# Patient Record
Sex: Male | Born: 1950 | Race: Black or African American | Hispanic: No | Marital: Married | Smoking: Never smoker
Health system: Southern US, Community
[De-identification: ages and names within clinical notes are randomized; demographics above are authoritative.]

## PROBLEM LIST (undated history)

## (undated) DIAGNOSIS — J40 Bronchitis, not specified as acute or chronic: Secondary | ICD-10-CM

## (undated) DIAGNOSIS — I2510 Atherosclerotic heart disease of native coronary artery without angina pectoris: Secondary | ICD-10-CM

## (undated) DIAGNOSIS — M6282 Rhabdomyolysis: Secondary | ICD-10-CM

## (undated) DIAGNOSIS — Z95820 Peripheral vascular angioplasty status with implants and grafts: Secondary | ICD-10-CM

## (undated) DIAGNOSIS — R0600 Dyspnea, unspecified: Secondary | ICD-10-CM

## (undated) DIAGNOSIS — E785 Hyperlipidemia, unspecified: Secondary | ICD-10-CM

## (undated) DIAGNOSIS — G4733 Obstructive sleep apnea (adult) (pediatric): Secondary | ICD-10-CM

## (undated) DIAGNOSIS — F419 Anxiety disorder, unspecified: Secondary | ICD-10-CM

## (undated) DIAGNOSIS — G4730 Sleep apnea, unspecified: Secondary | ICD-10-CM

## (undated) DIAGNOSIS — I219 Acute myocardial infarction, unspecified: Secondary | ICD-10-CM

## (undated) DIAGNOSIS — I10 Essential (primary) hypertension: Secondary | ICD-10-CM

## (undated) DIAGNOSIS — F4310 Post-traumatic stress disorder, unspecified: Secondary | ICD-10-CM

## (undated) DIAGNOSIS — K219 Gastro-esophageal reflux disease without esophagitis: Secondary | ICD-10-CM

## (undated) HISTORY — DX: Obstructive sleep apnea (adult) (pediatric): G47.33

## (undated) HISTORY — DX: Atherosclerotic heart disease of native coronary artery without angina pectoris: I25.10

## (undated) HISTORY — DX: Rhabdomyolysis: M62.82

## (undated) HISTORY — DX: Essential (primary) hypertension: I10

## (undated) HISTORY — DX: Peripheral vascular angioplasty status with implants and grafts: Z95.820

## (undated) HISTORY — DX: Acute myocardial infarction, unspecified: I21.9

## (undated) HISTORY — PX: OTHER SURGICAL HISTORY: SHX169

## (undated) HISTORY — DX: Anxiety disorder, unspecified: F41.9

## (undated) HISTORY — DX: Dyspnea, unspecified: R06.00

## (undated) HISTORY — DX: Hyperlipidemia, unspecified: E78.5

## (undated) HISTORY — DX: Bronchitis, not specified as acute or chronic: J40

## (undated) HISTORY — DX: Gastro-esophageal reflux disease without esophagitis: K21.9

## (undated) HISTORY — DX: Sleep apnea, unspecified: G47.30

## (undated) HISTORY — DX: Post-traumatic stress disorder, unspecified: F43.10

---

## 2005-06-05 ENCOUNTER — Ambulatory Visit: Payer: Self-pay | Admitting: Cardiology

## 2005-06-06 ENCOUNTER — Inpatient Hospital Stay (HOSPITAL_COMMUNITY): Admission: EM | Admit: 2005-06-06 | Discharge: 2005-06-09 | Payer: Self-pay | Admitting: Internal Medicine

## 2005-06-09 ENCOUNTER — Ambulatory Visit: Payer: Self-pay | Admitting: Cardiology

## 2005-06-17 ENCOUNTER — Ambulatory Visit: Payer: Self-pay | Admitting: Cardiology

## 2005-07-03 ENCOUNTER — Ambulatory Visit: Payer: Self-pay | Admitting: Cardiology

## 2005-09-21 ENCOUNTER — Inpatient Hospital Stay (HOSPITAL_COMMUNITY): Admission: EM | Admit: 2005-09-21 | Discharge: 2005-09-24 | Payer: Self-pay | Admitting: Family Medicine

## 2005-09-21 ENCOUNTER — Ambulatory Visit: Payer: Self-pay | Admitting: Pulmonary Disease

## 2005-09-21 ENCOUNTER — Ambulatory Visit: Payer: Self-pay | Admitting: *Deleted

## 2005-09-22 ENCOUNTER — Encounter: Payer: Self-pay | Admitting: Cardiovascular Disease

## 2005-10-19 ENCOUNTER — Ambulatory Visit: Admission: RE | Admit: 2005-10-19 | Discharge: 2005-10-19 | Payer: Self-pay | Admitting: Pulmonary Disease

## 2005-11-03 ENCOUNTER — Ambulatory Visit: Payer: Self-pay | Admitting: Pulmonary Disease

## 2005-11-04 ENCOUNTER — Ambulatory Visit: Payer: Self-pay | Admitting: Pulmonary Disease

## 2006-01-20 ENCOUNTER — Ambulatory Visit: Payer: Self-pay | Admitting: Cardiology

## 2006-01-20 ENCOUNTER — Ambulatory Visit: Payer: Self-pay | Admitting: Internal Medicine

## 2006-01-20 ENCOUNTER — Inpatient Hospital Stay (HOSPITAL_COMMUNITY): Admission: AD | Admit: 2006-01-20 | Discharge: 2006-01-21 | Payer: Self-pay | Admitting: Internal Medicine

## 2007-06-13 ENCOUNTER — Ambulatory Visit: Payer: Self-pay | Admitting: Cardiology

## 2007-08-15 ENCOUNTER — Inpatient Hospital Stay (HOSPITAL_COMMUNITY): Admission: AD | Admit: 2007-08-15 | Discharge: 2007-08-18 | Payer: Self-pay | Admitting: Cardiology

## 2007-08-15 ENCOUNTER — Ambulatory Visit: Payer: Self-pay | Admitting: Cardiology

## 2007-08-15 ENCOUNTER — Ambulatory Visit: Payer: Self-pay | Admitting: Cardiovascular Disease

## 2007-08-24 ENCOUNTER — Ambulatory Visit: Payer: Self-pay | Admitting: Cardiology

## 2007-09-12 DIAGNOSIS — I10 Essential (primary) hypertension: Secondary | ICD-10-CM | POA: Insufficient documentation

## 2007-09-12 DIAGNOSIS — K219 Gastro-esophageal reflux disease without esophagitis: Secondary | ICD-10-CM

## 2007-09-12 DIAGNOSIS — F411 Generalized anxiety disorder: Secondary | ICD-10-CM | POA: Insufficient documentation

## 2007-09-12 DIAGNOSIS — F4310 Post-traumatic stress disorder, unspecified: Secondary | ICD-10-CM | POA: Insufficient documentation

## 2007-09-12 DIAGNOSIS — E785 Hyperlipidemia, unspecified: Secondary | ICD-10-CM

## 2007-09-12 DIAGNOSIS — I2510 Atherosclerotic heart disease of native coronary artery without angina pectoris: Secondary | ICD-10-CM | POA: Insufficient documentation

## 2007-09-13 ENCOUNTER — Ambulatory Visit: Payer: Self-pay | Admitting: Pulmonary Disease

## 2007-09-13 DIAGNOSIS — R0602 Shortness of breath: Secondary | ICD-10-CM

## 2007-09-13 DIAGNOSIS — J209 Acute bronchitis, unspecified: Secondary | ICD-10-CM

## 2007-09-14 DIAGNOSIS — J45909 Unspecified asthma, uncomplicated: Secondary | ICD-10-CM | POA: Insufficient documentation

## 2007-09-14 DIAGNOSIS — G4730 Sleep apnea, unspecified: Secondary | ICD-10-CM | POA: Insufficient documentation

## 2007-10-03 ENCOUNTER — Telehealth (INDEPENDENT_AMBULATORY_CARE_PROVIDER_SITE_OTHER): Payer: Self-pay | Admitting: *Deleted

## 2008-03-19 ENCOUNTER — Telehealth: Payer: Self-pay | Admitting: Pulmonary Disease

## 2009-03-05 DIAGNOSIS — R079 Chest pain, unspecified: Secondary | ICD-10-CM | POA: Insufficient documentation

## 2009-11-06 IMAGING — CR DG CHEST 2V
2 series · 2 of 2 positions shown · non-contrast
Comparison: 09/21/2005

CLINICAL DATA: DYSPNEA;  SHORT OF BREATH.  CHEST CONGESTION.
HYPERTENSION.  CHEST, TWO VIEWS COMPARISON

CHEST - 2 VIEW

[view not recorded (1 of 2)]
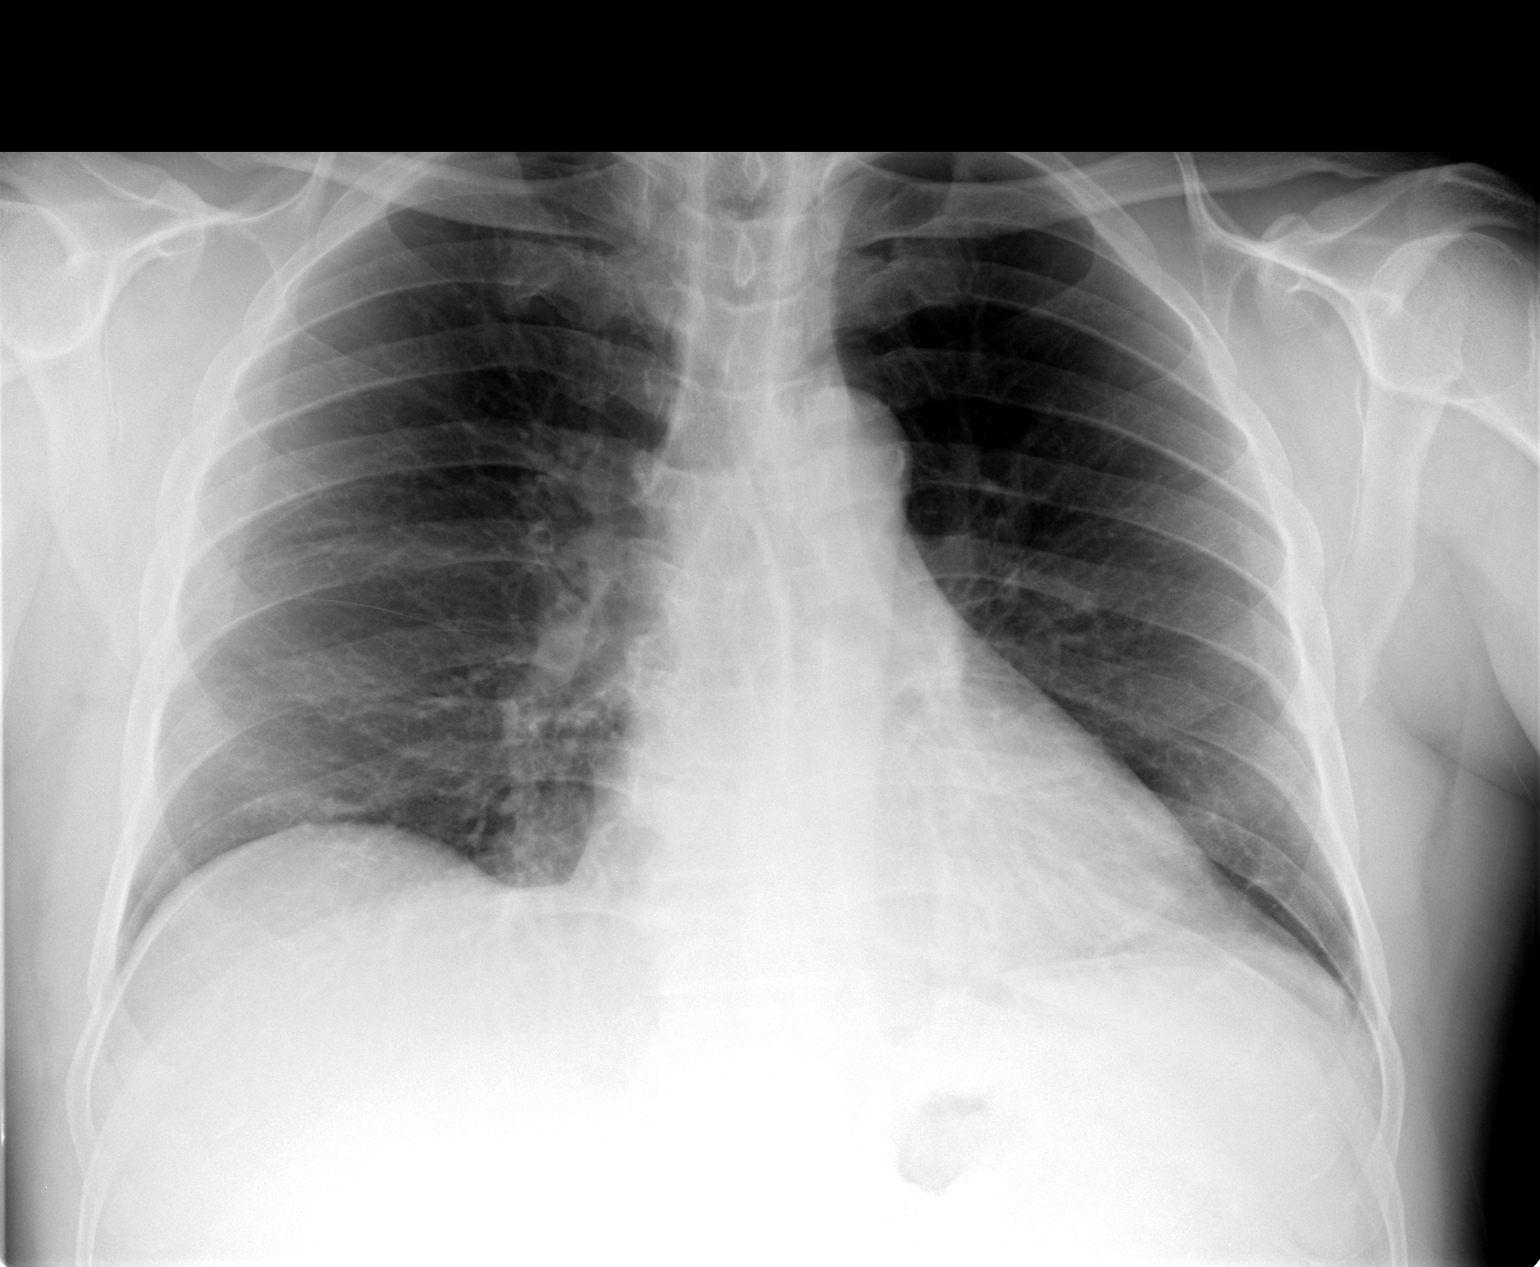

[view not recorded (2 of 2)]
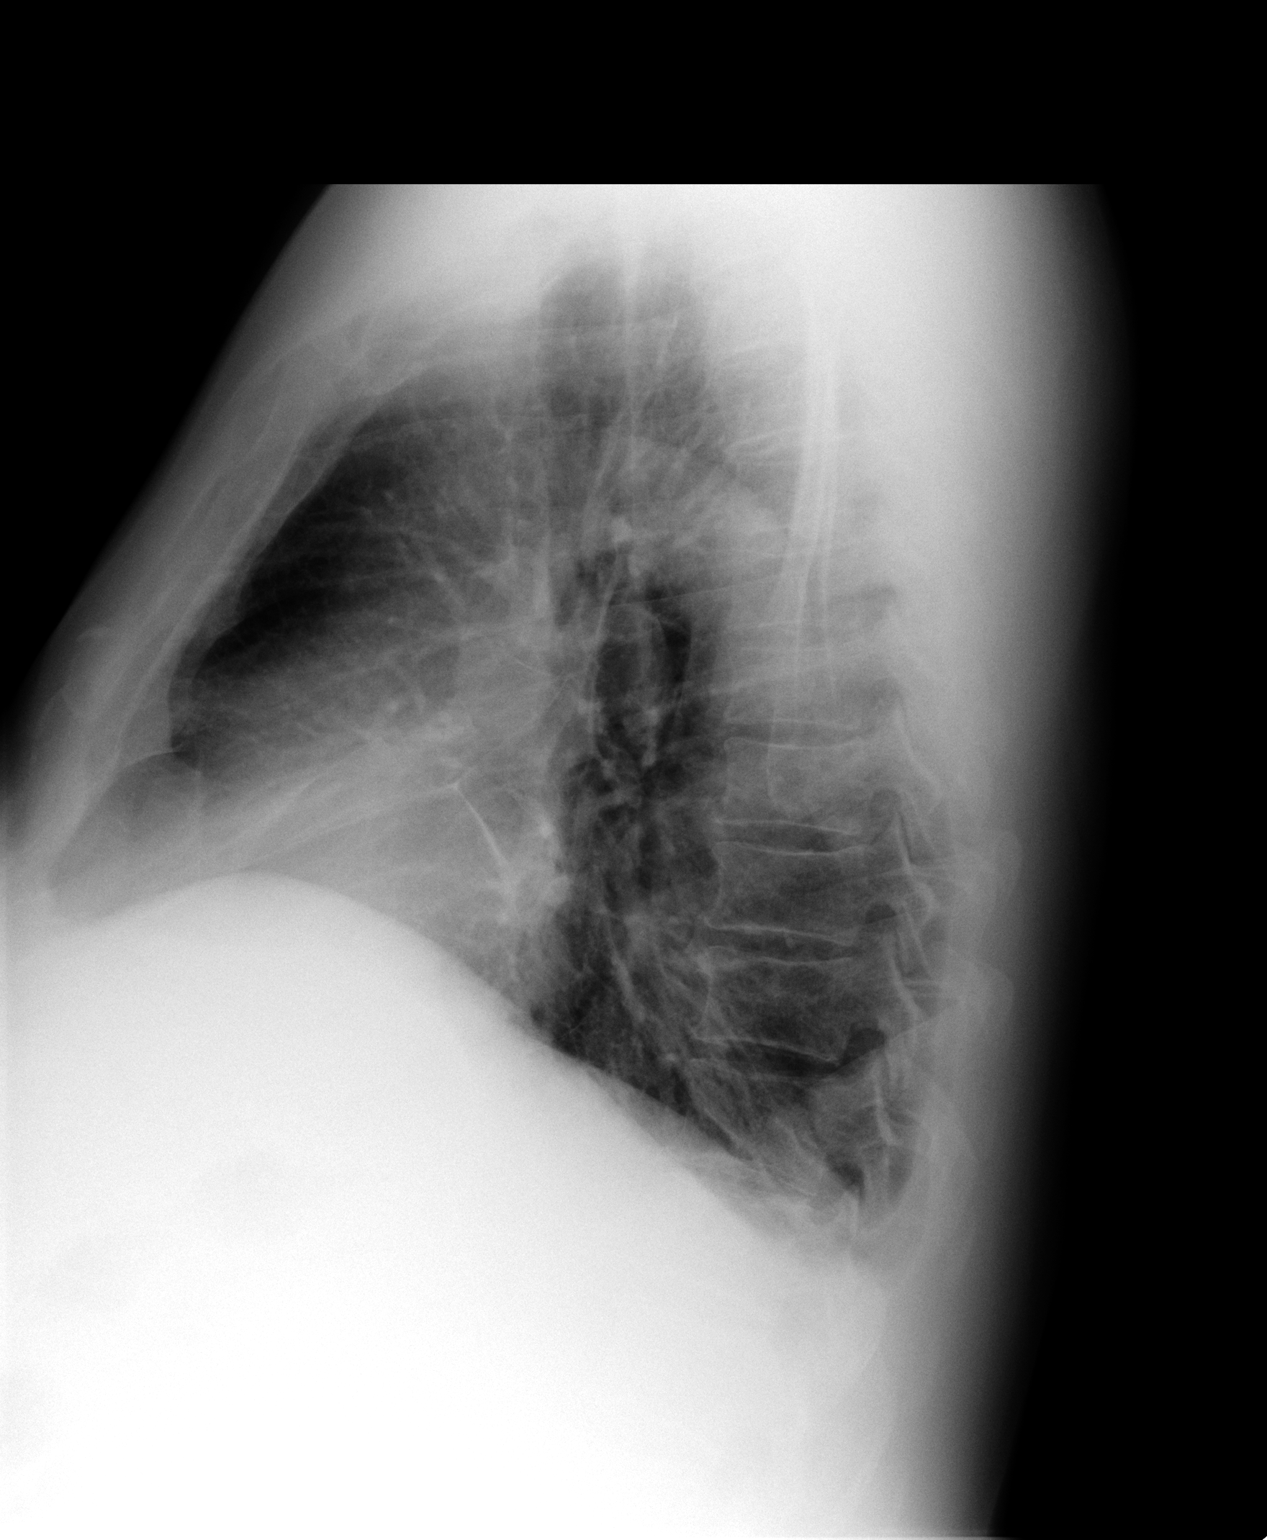

[2 of 2 positions shown; findings below may reference images not displayed]

FINDINGS: There are small bilateral pleural effusions.  There is
patchy density at the lung bases that could be atelectasis or
atelectatic pneumonia.  The upper lobes are clear.  The heart is at
the upper limits normal in size.  The vascularity appears normal.
IMPRESSION: Small pleural effusions.  Patchy density in the lower lobes that
could be atelectasis or atelectatic pneumonia.

## 2010-02-25 ENCOUNTER — Encounter: Admission: RE | Admit: 2010-02-25 | Discharge: 2010-02-25 | Payer: Self-pay | Admitting: Internal Medicine

## 2010-03-13 ENCOUNTER — Observation Stay (HOSPITAL_COMMUNITY)
Admission: RE | Admit: 2010-03-13 | Discharge: 2010-03-14 | Payer: Self-pay | Source: Home / Self Care | Admitting: Neurosurgery

## 2010-07-07 ENCOUNTER — Encounter: Payer: Self-pay | Admitting: Cardiology

## 2010-07-08 ENCOUNTER — Encounter: Payer: Self-pay | Admitting: Cardiology

## 2010-07-08 ENCOUNTER — Inpatient Hospital Stay (HOSPITAL_COMMUNITY)
Admission: AD | Admit: 2010-07-08 | Discharge: 2010-07-09 | Payer: Self-pay | Attending: Cardiovascular Disease | Admitting: Cardiovascular Disease

## 2010-07-09 LAB — GLUCOSE, CAPILLARY
Glucose-Capillary: 172 mg/dL — ABNORMAL HIGH (ref 70–99)
Glucose-Capillary: 161 mg/dL — ABNORMAL HIGH (ref 70–99)
Glucose-Capillary: 166 mg/dL — ABNORMAL HIGH (ref 70–99)

## 2010-07-09 LAB — MRSA PCR SCREENING: MRSA by PCR: NEGATIVE

## 2010-07-14 LAB — BASIC METABOLIC PANEL
BUN: 9 mg/dL (ref 6–23)
CO2: 25 mEq/L (ref 19–32)
Calcium: 8.1 mg/dL — ABNORMAL LOW (ref 8.4–10.5)
Chloride: 102 mEq/L (ref 96–112)
Creatinine, Ser: 1.03 mg/dL (ref 0.4–1.5)
GFR calc Af Amer: >60 mL/min (ref >60)
GFR calc non Af Amer: >60 mL/min (ref >60)
Glucose, Bld: 204 mg/dL — ABNORMAL HIGH (ref 70–99)
Potassium: 4.1 mEq/L (ref 3.5–5.1)
Sodium: 137 mEq/L (ref 135–145)

## 2010-07-14 LAB — CBC
HCT: 39.6 % (ref 39.0–52.0)
Hemoglobin: 12.9 g/dL — ABNORMAL LOW (ref 13.0–17.0)
MCH: 25.7 pg — ABNORMAL LOW (ref 26.0–34.0)
MCHC: 32.6 g/dL (ref 30.0–36.0)
MCV: 79 fL (ref 78.0–100.0)
Platelets: 176 10*3/uL (ref 150–400)
RBC: 5.01 MIL/uL (ref 4.22–5.81)
RDW: 14.4 % (ref 11.5–15.5)
WBC: 7.7 10*3/uL (ref 4.0–10.5)

## 2010-07-14 LAB — LIPID PANEL
Cholesterol: 249 mg/dL — ABNORMAL HIGH (ref 0–200)
HDL: 33 mg/dL — ABNORMAL LOW (ref >39)
LDL Cholesterol: UNDETERMINED mg/dL (ref 0–99)
Total CHOL/HDL Ratio: 7.5 RATIO
Triglycerides: 704 mg/dL — ABNORMAL HIGH (ref <150)
VLDL: UNDETERMINED mg/dL (ref 0–40)

## 2010-07-14 LAB — GLUCOSE, CAPILLARY
Glucose-Capillary: 246 mg/dL — ABNORMAL HIGH (ref 70–99)
Glucose-Capillary: 194 mg/dL — ABNORMAL HIGH (ref 70–99)
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
## 2010-07-16 NOTE — Procedures (Addendum)
Jeff Moran, JACUINDE NO.:  [id number]  MEDICAL RECORD NO.:  [id number]          PATIENT TYPE:  OIB  LOCATION:  6527                         FACILITY:  [facility]  PHYSICIAN:  Verne Carrow, MDDATE OF BIRTH:  08-12-1950  DATE OF PROCEDURE:  07/08/2010 DATE OF DISCHARGE:                           CARDIAC CATHETERIZATION   PRIMARY CARDIOLOGIST:  Learta Codding, MD, [facility]  PRIMARY CARE PHYSICIAN:  Dr. Lia Hopping.  PROCEDURE PERFORMED: 1. Left heart catheterization. 2. Selective coronary angiography. 3. PTCA with placement of a drug-eluting stent in the mid right     coronary artery.  OPERATOR:  Verne Carrow, MD  ARTERIAL ACCESS SITE:  Right radial artery.  INDICATIONS:  This is a 60 year old African American male with a history of coronary artery disease as well as insulin-dependent diabetes mellitus, hypertension, and hyperlipidemia who presents to [facility] with a non-ST-elevation myocardial infarction.  The patient was transferred to [facility] today for a planned left heart catheterization with possible PCI.  DETAILS OF PROCEDURE:  The patient was brought to the main cardiac catheterization laboratory after signing informed consent for the procedure.  An Freida Busman test was performed on the right wrist and was positive.  The right wrist was prepped and draped in a sterile fashion. Lidocaine 1% was used for local anesthesia.  A 6-French sheath was inserted into the right radial artery without difficulty.  A 3 mg of verapamil was given through the sheath.  A 4000 units of intravenous heparin was given after sheath insertion.  We then selectively engaged the native right coronary artery with a JR-4 diagnostic catheter.  This catheter was not a good fit for the vessel; however, we were able to obtain images.  We then had considerable difficulty engaging the left main artery.  We went through multiple catheters before finding  a catheter that would fit.  Ultimately, we used an XB LAD 3.0 guiding catheter.  This did add considerably to our fluoroscopy time during the case.  We felt that the culprit vessel was the severe stenosis in the mid right coronary artery.  We then gave the patient a bolus of Angiomax and started a drip.  The patient had been previously loaded with Plavix. I attempted to gain access to the native right coronary artery with a JR- 4 guiding catheter but was unable to selectively engage this vessel.  We then used an Amplatz right 1 guiding catheter which also was not a good fit for the vessel.  Ultimately, we used a 3-DRC guiding catheter to selectively engage the native right coronary artery.  Once again, this added considerably to our fluoroscopy time with difficulty engaging catheters because of severe tortuosity in the aortic arch as well as in the root.  After we engaged the native right coronary artery, we passed a Cougar intracoronary wire down the length of the right coronary artery.  A 2.5 x 15-mm balloon was used to predilate the lesion.  A 3.0 x 28-mm Promus Element drug-eluting stent was carefully positioned in the mid vessel and was deployed at 12 atmospheres.  A 3.5 x 20-mm noncompliant balloon  was carefully positioned inside the stent and was inflated twice in overlapping inflations to 15 atmospheres.  There was an excellent angiographic result with good flow into the distal vessel. The patient tolerated the procedure well.  The stenosis was taken from 99% down to 0%.  There were no immediate complications.  The patient was taken to the holding area in stable condition.  Of note, the sheath was removed here in the cath lab and Terumo hemostasis band was applied over the arteriotomy site.  HEMODYNAMIC FINDINGS:  Central aortic pressure 112/75.  Left ventricular pressure 116/5.  Left ventricular end-diastolic pressure 10.  ANGIOGRAPHIC FINDINGS: 1. The left main coronary  artery had no obstructive disease. 2. The left anterior descending was a large vessel that coursed to the     apex and gave off a large septal perforating branch and 2 moderate-     sized diagonal branches.  The septal perforating branch had an     ostial 80% stenosis.  The first diagonal branch appeared to have     segment of 70% stenosis.  The mid LAD had a 60% stenosis just after     the takeoff of the septal perforating branch.  The apical LAD had     80-90% stenosis. 3. The circumflex artery had a stent that was patent in the proximal     segment and a stent patent in the first obtuse marginal branch. 4. The right coronary artery was a large dominant vessel with a long     segment of 99% stenosis in the mid vessel that was felt to be the     culprit lesion. 5. No left ventricular angiogram was performed.  IMPRESSION: 1. Non-ST-elevation myocardial infarction secondary to severe     obstruction in the mid right coronary artery. 2. Triple-vessel coronary artery disease with patent stents in the     circumflex artery. 3. Successful percutaneous transluminal coronary angioplasty with     placement of a drug-eluting stent in the mid right coronary artery.  RECOMMENDATIONS:  This patient has diffuse disease in his left anterior descending artery as well as the moderate-sized diagonal branch.  I felt that his culprit vessel was the severe stenosis in the mid right coronary artery.  We will continue with medical management following the stent placement.  If the patient has recurrent symptoms, we will have to consider performing angioplasty of the lesions in the left anterior descending artery and possibly the diagonal branch off the left anterior descending artery.  We may also have to consider bypass surgery in the future given the multiple locations of disease in this patient.  He will need to be on aspirin and Plavix for at least 1 year.     Verne Carrow,  MD     CM/MEDQ  D:  07/08/2010  T:  07/09/2010  Job:  [id number]  cc:   Learta Codding, MD,FACC Lia Hopping  Electronically Signed by Verne Carrow MD on 07/16/2010 04:10:52 PM

## 2010-07-17 LAB — POCT ACTIVATED CLOTTING TIME: Activated Clotting Time: 558 seconds

## 2010-07-31 ENCOUNTER — Encounter: Payer: Self-pay | Admitting: Cardiology

## 2010-07-31 ENCOUNTER — Ambulatory Visit (INDEPENDENT_AMBULATORY_CARE_PROVIDER_SITE_OTHER): Payer: PRIVATE HEALTH INSURANCE | Admitting: Cardiology

## 2010-07-31 DIAGNOSIS — I2510 Atherosclerotic heart disease of native coronary artery without angina pectoris: Secondary | ICD-10-CM

## 2010-07-31 DIAGNOSIS — Z9861 Coronary angioplasty status: Secondary | ICD-10-CM

## 2010-08-07 NOTE — Consult Note (Signed)
Summary: [facility] CARDIOLOGY CONSULT  [facility]   Imported By: Zachary George 07/30/2010 16:39:16  _____________________________________________________________________  External Attachment:    Type:   Image     Comment:   External Document

## 2010-08-07 NOTE — Letter (Signed)
Summary: Internal Other/ PATIENT HISTORY FORM  Internal Other/ PATIENT HISTORY FORM   Imported By: Dorise Hiss 08/01/2010 12:31:18  _____________________________________________________________________  External Attachment:    Type:   Image     Comment:   External Document

## 2010-08-07 NOTE — Assessment & Plan Note (Signed)
Summary: [facility] - [facility] 1/18-SRS   Visit Type:  Follow-up Referring Provider:  Peyton Bottoms Primary Provider:  Lita Mains   History of Present Illness: the patient is a 60 year old male who was recently seen for a non-ST elevation myocardial infarction.  The patient underwent stent placement to the right coronary artery.  It has had prior multiple procedures.  He has a history of rhabdomyolysis November 2011.  He has been unable to tolerate statins because of elevated liver function test.  Also the patient has chronically elevated CKs.  The patient is seeing a rheumatologist and the plan is to obtain a biopsy in the future.  The patient has been doing well since his catheterization and stent placement.  He denies any chest pain shortness of breath orthopnea or PND.  His blood pressure however is poorly controlled.  If unable to tolerate beta-blocker secondary to a history of asthma.  The patient denies any palpitations presyncope or syncope.  Preventive Screening-Counseling & Management  Alcohol-Tobacco     Smoking Status: never  Current Medications (verified): 1)  Norvasc 10 Mg Tabs (Amlodipine Besylate) .... Take 1 Tablet By Mouth Once A Day 2)  Plavix 75 Mg  Tabs (Clopidogrel Bisulfate) .... Take 1 Tablet By Mouth Once A Day 3)  Bayer Aspirin 325 Mg  Tabs (Aspirin) .... Take 1 Tablet By Mouth Once A Day 4)  Atenolol 100 Mg Tabs (Atenolol) .... Take 1 Tablet By Mouth Once A Day 5)  Clonidine Hcl 0.3 Mg Tabs (Clonidine Hcl) .... Take 1 Tablet By Mouth Two Times A Day 6)  Glucotrol 10 Mg  Tabs (Glipizide) .... Take 1 Tablet By Mouth Two Times A Day 7)  Advair Diskus 250-50 Mcg/dose  Misc (Fluticasone-Salmeterol) .... 2 Puff Two Times A Day 8)  Zocor 80 Mg  Tabs (Simvastatin) .... Take 1 Tablet By Mouth Once A Day 9)  Nitroquick 0.4 Mg  Subl (Nitroglycerin) .... As Needed 10)  Omeprazole 40 Mg Cpdr (Omeprazole) .... Take 1 Tablet By Mouth Two Times A Day 11)  Singulair 10 Mg   Tabs (Montelukast Sodium) .... One By Mouth At Bedtime 12)  Tylenol 325 Mg Tabs (Acetaminophen) .... Take 2 By Mouth Every 4 Hours As Needed 13)  Aleve 220 Mg Tabs (Naproxen Sodium) .... Take 2 Tablet By Mouth Two Times A Day As Needed 14)  Allopurinol 100 Mg Tabs (Allopurinol) .... Take 1 Tablet By Mouth Two Times A Day 15)  Citalopram Hydrobromide 40 Mg Tabs (Citalopram Hydrobromide) .... Take 1 Tablet By Mouth Once A Day 16)  Combivent 18-103 Mcg/act Aero (Ipratropium-Albuterol) .... Take 2 Tablet By Mouth Two Times A Day 17)  Fish Oil 1000 Mg Caps (Omega-3 Fatty Acids) .... Take 1 Tablet By Mouth Two Times A Day 18)  Humulin R 100 Unit/ml Soln (Insulin Regular Human) .... Use As Directed 19)  Lantus 100 Unit/ml Soln (Insulin Glargine) .... Use As Directed 20)  Mucinex Maximum Strength 1200 Mg Xr12h-Tab (Guaifenesin) .... Take 1 Tablet By Mouth Two Times A Day 21)  Muro 128 2 % Soln (Sodium Chloride (Hypertonic)) .... One Drop Ou Two Times A Day 22)  Seroquel 400 Mg Tabs (Quetiapine Fumarate) .... Take 1 Tablet By Mouth Once A Day 23)  Pentazocine-Naloxone Hcl 50-0.5 Mg Tabs (Pentazocine-Naloxone) .... Take 1 Tablet By Mouth Two Times A Day 24)  Tramadol Hcl 50 Mg Tabs (Tramadol Hcl) .... Take 1 Tablet By Mouth Four Times A Day As Needed 25)  Chlorthalidone 25  Mg Tabs (Chlorthalidone) .... Take 1 Tablet By Mouth Once A Day  Allergies (verified): No Known Drug Allergies  Comments:  Nurse/Medical Assistant: The patient's medication list and allergies were reviewed with the patient and were updated in the Medication and Allergy Lists.  Past History:  Past Surgical History: Last updated: 03/05/2009 R & L hand  Family History: Last updated: 07/31/2010 Negative FH of Diabetes, Hypertension, or Coronary Artery Disease  Social History: Last updated: 03/05/2009 Retired  Disabled  Married  Tobacco Use - No.  Alcohol Use - no Regular Exercise - yes Drug Use - no  Past Medical  History: CHEST PAIN-UNSPECIFIED (ICD-786.50) ACUTE BRONCHITIS (ICD-466.0) SLEEP APNEA (ICD-780.57) ASTHMA (ICD-493.90) DYSPNEA (ICD-786.05) CORONARY HEART DISEASE (ICD-414.00) non-ST elevation myocardial infarction July 08, 2010 triple vessel disease with PTCA and placement of a drug-eluting stent to the right coronary artery status post drug eluding stent to a subtotally occluded first marginal there were 2009 status post drug-eluting stent of the circumflex December 2006 preserved LV function dyslipidemia statin intolerance triglycerides 704 total cholesterol 249 diabetes mellitus hypertension history of rhabdomyolysis obstructive sleep apnea on CPAP POSTTRAUMATIC STRESS DISORDER (ICD-309.81) ANXIETY (ICD-300.00) HYPERTENSION (ICD-401.9) HYPERLIPIDEMIA (ICD-272.4) G E R D (ICD-530.81) Post Traumatic Stress Disorder Bruxism  Family History: Negative FH of Diabetes, Hypertension, or Coronary Artery Disease  Review of Systems       The patient complains of fatigue and muscle weakness.  The patient denies malaise, fever, weight gain/loss, vision loss, decreased hearing, hoarseness, chest pain, palpitations, shortness of breath, prolonged cough, wheezing, sleep apnea, coughing up blood, abdominal pain, blood in stool, nausea, vomiting, diarrhea, heartburn, incontinence, blood in urine, joint pain, leg swelling, rash, skin lesions, headache, fainting, dizziness, depression, anxiety, enlarged lymph nodes, easy bruising or bleeding, and environmental allergies.    Vital Signs:  Patient profile:   60 year old male Height:      71 inches Weight:      240 pounds BMI:     33.59 Pulse rate:   67 / minute BP sitting:   167 / 101  (left arm) Cuff size:   large  Vitals Entered By: Carlye Grippe (July 31, 2010 10:06 AM)  Nutrition Counseling: Patient's BMI is greater than 25 and therefore counseled on weight management options.  Physical Exam  Additional Exam:  General:  Well-developed, well-nourished in no distress head: Normocephalic and atraumatic eyes PERRLA/EOMI intact, conjunctiva and lids normal nose: No deformity or lesions mouth normal dentition, normal posterior pharynx neck: Supple, no JVD.  No masses, thyromegaly or abnormal cervical nodes lungs: Normal breath sounds bilaterally without wheezing.  Normal percussion heart: regular rate and rhythm with normal S1 and S2, no S3 or S4.  PMI is normal.  No pathological murmurs abdomen: Normal bowel sounds, abdomen is soft and nontender without masses, organomegaly or hernias noted.  No hepatosplenomegaly musculoskeletal: Back normal, normal gait muscle strength and tone normal pulsus: Pulse is normal in all 4 extremities Extremities: No peripheral pitting edema neurologic: Alert and oriented x 3 skin: Intact without lesions or rashes cervical nodes: No significant adenopathy psychologic: Normal affect    Impression & Recommendations:  Problem # 1:  CORONARY HEART DISEASE (ICD-414.00) the patient status post recent drug-eluting stent placement.  He will need Plavix indefinitely given multiple prior drug-eluting stents.  However there may be plans for a muscle biopsy and I told the patient deferred this for at least a year.  If the biopsy needs to be done earlier Plavix should be continued  during the procedure.  This was very clearly stated and explained to the patient and his wife.  He understands the risk of premature discontinuation of Plavix and stent thrombosis. His updated medication list for this problem includes:    Norvasc 10 Mg Tabs (Amlodipine besylate) .Marland Kitchen... Take 1 tablet by mouth once a day    Plavix 75 Mg Tabs (Clopidogrel bisulfate) .Marland Kitchen... Take 1 tablet by mouth once a day    Bayer Aspirin 325 Mg Tabs (Aspirin) .Marland Kitchen... Take 1 tablet by mouth once a day    Atenolol 100 Mg Tabs (Atenolol) .Marland Kitchen... Take 1 tablet by mouth once a day    Nitroquick 0.4 Mg Subl (Nitroglycerin) .Marland Kitchen... As  needed  Problem # 2:  HYPERTENSION (ICD-401.9) blood pressure is poorly controlled.  We will increase amlodipine to 10 mg p.o. daily.  Chlorthalidone will be added 25 mg p.o. daily.  For the patient to follow-up on his blood pressure. His updated medication list for this problem includes:    Norvasc 10 Mg Tabs (Amlodipine besylate) .Marland Kitchen... Take 1 tablet by mouth once a day    Bayer Aspirin 325 Mg Tabs (Aspirin) .Marland Kitchen... Take 1 tablet by mouth once a day    Atenolol 100 Mg Tabs (Atenolol) .Marland Kitchen... Take 1 tablet by mouth once a day    Clonidine Hcl 0.3 Mg Tabs (Clonidine hcl) .Marland Kitchen... Take 1 tablet by mouth two times a day    Chlorthalidone 25 Mg Tabs (Chlorthalidone) .Marland Kitchen... Take 1 tablet by mouth once a day  Problem # 3:  HYPERLIPIDEMIA (ICD-272.4) the patient will need during his next clinic visit follow-up on his liver function tests.  It is my understanding that is currently not taking his statin.Will hold statin until after muscle biopsy. His updated medication list for this problem includes:    Zocor 80 Mg Tabs (Simvastatin) .Marland Kitchen... Take 1 tablet by mouth once a day  Patient Instructions: 1)  Chlorthalidone 25mg  daily 2)  Increase Norvasc to 10mg  daily 3)  Patient to call with blood pressure readings in 7-10 days 4)  Hold Statin until after muscle biopsy 5)  DO NOT STOP PLAVIX FOR AT LEAST ONE YEAR 6)  Follow up in  6 months Prescriptions: NORVASC 10 MG TABS (AMLODIPINE BESYLATE) Take 1 tablet by mouth once a day  #30 x 6   Entered by:   Hoover Brunette, LPN   Authorized by:   Lewayne Bunting, MD, [facility]   Signed by:   Hoover Brunette, LPN on 16/03/9603   Method used:   Electronically to        [facility]* (retail)       [address]       [place], [place]  [address]       Ph: [phone]       Fax: [phone]   RxID:   [id number] CHLORTHALIDONE 25 MG TABS (CHLORTHALIDONE) Take 1 tablet by mouth once a day  #30 x 6   Entered by:   Hoover Brunette, LPN   Authorized by:    Lewayne Bunting, MD, [facility]   Signed by:   Hoover Brunette, LPN on 04/18/2535   Method used:   Electronically to        [facility]* (retail)       [address]       [place], [place]  [address]  Ph: [phone]       Fax: [phone]   RxID:   [id number]   Handout requested.

## 2010-08-07 NOTE — Medication Information (Signed)
Summary: [facility] D/C MEDICATION SHEET ORDER  [facility] D/C MEDICATION SHEET ORDER   Imported By: Zachary George 07/30/2010 16:38:48  _____________________________________________________________________  External Attachment:    Type:   Image     Comment:   External Document

## 2010-08-25 DIAGNOSIS — I200 Unstable angina: Secondary | ICD-10-CM

## 2010-09-04 LAB — BASIC METABOLIC PANEL
BUN: 9 mg/dL (ref 6–23)
CO2: 24 mEq/L (ref 19–32)
Calcium: 9 mg/dL (ref 8.4–10.5)
Chloride: 98 mEq/L (ref 96–112)
Creatinine, Ser: 0.9 mg/dL (ref 0.4–1.5)
GFR calc Af Amer: >60 mL/min (ref >60)
GFR calc non Af Amer: >60 mL/min (ref >60)
Glucose, Bld: 204 mg/dL — ABNORMAL HIGH (ref 70–99)
Potassium: 3.6 mEq/L (ref 3.5–5.1)
Sodium: 133 mEq/L — ABNORMAL LOW (ref 135–145)

## 2010-09-04 LAB — APTT: aPTT: 29 seconds (ref 24–37)

## 2010-09-04 LAB — URINE MICROSCOPIC-ADD ON

## 2010-09-04 LAB — CBC
HCT: 44.5 % (ref 39.0–52.0)
Hemoglobin: 14.9 g/dL (ref 13.0–17.0)
MCH: 26.6 pg (ref 26.0–34.0)
MCHC: 33.5 g/dL (ref 30.0–36.0)
MCV: 79.3 fL (ref 78.0–100.0)
Platelets: 199 10*3/uL (ref 150–400)
RBC: 5.61 MIL/uL (ref 4.22–5.81)
RDW: 13.4 % (ref 11.5–15.5)
WBC: 7.2 10*3/uL (ref 4.0–10.5)

## 2010-09-04 LAB — URINALYSIS, ROUTINE W REFLEX MICROSCOPIC
Bilirubin Urine: NEGATIVE
Glucose, UA: >1000 mg/dL — AB
Hgb urine dipstick: NEGATIVE
Ketones, ur: NEGATIVE mg/dL
Leukocytes, UA: NEGATIVE
Nitrite: NEGATIVE
Protein, ur: NEGATIVE mg/dL
Specific Gravity, Urine: 1.026 (ref 1.005–1.030)
Urobilinogen, UA: 1 mg/dL (ref 0.0–1.0)
pH: 6 (ref 5.0–8.0)

## 2010-09-04 LAB — PROTIME-INR
INR: 0.92 (ref 0.00–1.49)
Prothrombin Time: 12.6 seconds (ref 11.6–15.2)

## 2010-09-04 LAB — GLUCOSE, CAPILLARY
Glucose-Capillary: 177 mg/dL — ABNORMAL HIGH (ref 70–99)
Glucose-Capillary: 192 mg/dL — ABNORMAL HIGH (ref 70–99)
Glucose-Capillary: 201 mg/dL — ABNORMAL HIGH (ref 70–99)

## 2010-09-04 LAB — SURGICAL PCR SCREEN

## 2010-09-05 ENCOUNTER — Encounter: Payer: Self-pay | Admitting: *Deleted

## 2010-10-01 ENCOUNTER — Encounter: Payer: PRIVATE HEALTH INSURANCE | Admitting: Cardiology

## 2010-10-28 ENCOUNTER — Encounter: Payer: Self-pay | Admitting: Cardiology

## 2010-11-04 NOTE — Assessment & Plan Note (Signed)
[facility]                          [facility] OFFICE NOTE   NAME:Jeff Moran, Jeff Moran                        MRN:          [id number]  DATE:08/24/2007                            DOB:          16-Jun-1951    PRIMARY CARDIOLOGIST:  Dr. Lewayne Bunting.   REASON FOR VISIT:  Post hospital follow-up.   The patient was recently seen in consultation by [place] here at [facility] for  evaluation of chest pain in the setting of abnormal cardiac markers,  with a troponin of 0.23.  This was felt to be consistent with acute  coronary syndrome and he was subsequently transferred to [facility] for  same-day cardiac catheterization.  He was found to have subtotal  occlusion of the first obtuse marginal branch, successfully treated with  a PROMUS  drug-eluting stent.  Residual anatomy was notable for 60% mid  LAD and 50% apical stenosis; 70% first septal perforator; 70% long,  proximal first diagonal; and 50% mid RCA stenosis.  EF was 55 - 60%.   Recommendation was to treat with Plavix for least 1 year.   Postop course notable for resumption of clonidine and addition of  Norvasc.  No complications of the right groin incision site were noted.   Since discharge, the patient had a scheduled follow-up at the [facility]  yesterday; he informs me that he was placed on Lasix for treatment of  some chest congestion.  He is also on antibiotics, although it is not  clear when this was added to his regimen.  Neither the Lasix nor the  antibiotics were listed on his [facility] DC summary sheet.   The patient has not had any of the chest pain which precipitated his  most recent hospitalization here at [facility].  He did, however, take  sublingual nitro on one occasion for some atypical chest pain with  questionable relief.   Electrocardiogram today reveals sinus bradycardia at 54 bpm with normal  axis and no acute changes.   CURRENT MEDICATIONS:  Aspirin 325 daily, Plavix, Norvasc 5  daily,  atenolol 50 daily,  clonidine 0.2 b.i.d., Glucotrol 10 b.i.d., Advair 1  puff b.i.d., Zocor 80 q.h.s., Prilosec 20 b.i.d. Mucinex 1200 daily,  antibiotic, Lasix 10 daily, fish oil 800 daily, and albuterol MDI.   PHYSICAL EXAM:  Blood pressure 123/76, pulse 57, regular weight 218,  sats 96% on room air.  GENERAL:  A 59 year old male sitting upright in no distress.  HEENT:  Normocephalic, atraumatic.  NECK:  Palpable carotid pulse without bruits; no JVD at 90 degrees.  LUNGS:  Clear to auscultation all fields.  HEART:  Regular rate and rhythm (S1, S2) no significant murmurs.  No  rubs.  ABDOMEN:  Soft, nontender, intact bowel sounds.  EXTREMITIES:  Right groin stable with no ecchymosis, hematoma, or bruit  on auscultation.  Palpable femoral and distal pulse.  NEURO:  Very flat affect, but no focal deficits.   IMPRESSION:  1. Status post non-STEMI.      a.     Treated with PROMUS (DES) of subtotal first obtuse marginal  branch stenosis.      b.     Residual moderate LAD and RCA stenoses.      c.     EF 55 - 60%.      d.     Status post Taxus stenting of CFX, December 2006.  2. Chest pain syndrome      a.     Chronically elevated total CPKs  3. Obstructive sleep apnea      a.     Not compliant with CPAP.  4. Hypertension.  5. Dyslipidemia.  6. Type 2 diabetes mellitus.  7. Post-traumatic stress disorder  8. GERD.  9. Sinus bradycardia.   PLAN:  1. Continue current medication regimen.  2. Aggressive lipid management with target LDL goal of 70 or less, if      feasible.  3. Follow-up with Dr. [name] later today, as previously scheduled.  The      patient is also to maintain regular scheduled follow-up with his      medical team at [facility].  4. Schedule return clinic follow-up with myself and Dr. Andee Lineman in 3      months.      Gene Serpe, PA-C  Electronically Signed      Learta Codding, MD,FACC  Electronically Signed   GS/MedQ  DD: 08/24/2007   DT: 08/24/2007  Job #: [id number]   cc:   [name], MD

## 2010-11-04 NOTE — Discharge Summary (Signed)
Jeff Moran, Jeff Moran                 ACCOUNT NO.:  [id number]   MEDICAL RECORD NO.:  [id number]          PATIENT TYPE:  INP   LOCATION:  3710                         FACILITY:  [facility]   PHYSICIAN:  Everardo Beals. [name], MD, FACCDATE OF BIRTH:  Oct 21, 1950   DATE OF ADMISSION:  08/15/2007  DATE OF DISCHARGE:  08/18/2007                               DISCHARGE SUMMARY   PRIMARY CARDIOLOGIST:  Dr. [name].   PRIMARY CARE PHYSICIAN:  Jeff Downer,  [place] in [place].   PROCEDURES PERFORMED DURING HOSPITALIZATION:  1. Cardiac catheterization first performed by Dr. [name] on      August 18, 2007.  A:  Severe OM1 stenosis with subtotal occlusion, moderate LAD stenosis,  mild to moderate right coronary artery stenosis, preserved left  ventricular systolic function with an EF of 55 to 60%.  B:  Status post successful PTI using drug-alluding stent promise 2.75 x  23 mm to OM, improving from TIMI 1 TO TIMI 3 FLOW.   FINAL DISCHARGE DIAGNOSES:  1. Non ST-elevated myocardial infarction.  A:  Status post cardiac catheterization with drug-eluting stent to  obtuse marginal 1 per Dr. [name] on August 15, 2007.  1. Coronary artery disease revealing moderate left anterior descending      stenosis and mild right coronary artery stenosis with normal left      ventricular function of 55 to 60%.  A:  Status post Taxus stent to the circumflex artery in December of 2006  revealing a nonobstructive coronary artery disease per catheterization  in 2007.  1. Diabetes mellitus type 2.  2. Hypertension, not well controlled.  3. Hypercholesterolemia.  4. Posttraumatic stress disorder.  5. Recent history of pneumonia.  6. Gastroesophageal reflux disease.  A:  History of bleeding ulcer and peptic ulcer disease.  1. History of hepatitis B.   HOSPITAL COURSE:  This is a 60 year old African-American male with a  history of CAD status post Taxus  drug-eluting stent to the circumflex  in December of  2006 with a history of chronic chest pain.  He was  admitted to [name] with complaints of recurrent chest pain  on August 15, 2007.  The patient describes the pain as chest pressure  radiating to his left jaw and down his left arm.  He did have some mild  shortness of breath and diaphoresis as well as lightheadedness.  The  patient took nitroglycerin that had some relief, but his chest  discomfort remained despite the nitroglycerin.  EKG showed nonspecific  ST-T wave changes, but he remained in normal sinus rhythm.  His CPK was  found to be slightly elevated with MBs from 5.1 to 13.  His troponin  went from 0.04 to 0.23.  The patient was also found to be hypokalemic on  admission with a potassium of 2.8.  The patient was seen and examined by  Jeff Moran, physician assistant, and Dr. [name], cardiologist.  The  patient was placed on nitroglycerin drip and clonidine was withheld.  Secondary to this, he was placed on a beta-blocker and found to be a  candidate for cardiac catheterization and was transferred to [name]  for possible cardiac catheterization and intervention.   The patient was seen and examined by Dr. [name] on admission to  [name], and the patient underwent cardiac catheterization.  Please  see Dr. [name] thorough cardiac catheterization dictation for more  details.  It was found that the patient had severe obtuse marginal  stenosis of 99%.  The patient underwent successful PCI using a Promise  drug-eluting stent with restoration of flow from TIMI 1 to TIMI 3.  It  was also found that the patient had moderate LAD stenosis of 70% and 60%  with mild right coronary artery stenosis.  The patient will be treated  with Plavix and aspirin along with other medications prior to admission.   The patient was monitored in ICU overnight post procedure and did well  without any further complaints of chest discomfort.  The patient had no  arrhythmias seen  post procedure.  The patient did have some rebound  hypertension  with the discontinuation of clonidine, and this was  restarted during admission.  Highest blood pressure recorded was  175/106.  The patient remained mildly hypertensive during  hospitalization and was placed on Norvasc 5 mg 1 p.o. q day in addition  to institution of Lopressor and reinstitution of clonidine.  Blood  pressure stabilized on the  day of discharge with a blood pressure of  129/84, heart rate 81 and respirations were 18.  The patient had no  recurrence of chest pain and remained stable.  Evaluation of the right  groin site was found to be healthy without evidence of hematoma, bruit  or bleed.  The patient was seen and examined by Dr. [name] and  found to be stable for discharge with follow up with Dr. [name] and  primary care physician, Dr. Linna Moran for continued medical management of  diabetes.   DISCHARGE LABS:  Sodium 133, potassium 3.4, chloride 96, CO2 28, BUN 6,  creatinine 0.96, glucose 152, cholesterol 194, triglycerides 264, HDL  35, LDL 106, troponin maxed at 22.36 with a CK of 1688.  Hemoglobin  14.3, hematocrit 43.1, white blood cells 7.6, platelets 217.   DISCHARGE MEDICATIONS:  1. Norvasc 5 mg 1 p.o. q day.  2. Plavix 75 mg daily.  3. Aspirin 325 daily.  4. Metoprolol 25 mg twice a day.  5. Clonidine 0.1 mg twice a day.  6. Glucotrol 10 mg twice a day.  7. Advair 1 puff twice a day.  8. Zocor 80 mg at bedtime.  9. Nitroglycerin 0.4 mg p.r.n. chest pain.  10.Talwin p.r.n.  11.Prilosec twice a day 40 mg.  12.Imdur 30 mg daily (not restarted during hospitalization).   FOLLOWUP PLANS AND APPOINTMENT:  1. The patient will see Dr. [name] on March 4 at 12:20 p.m.,      Wednesday in [place].  2. The patient is to follow with Dr. Linna Moran in [place] for continued      medical management and evaluation of ongoing diabetes and blood      pressure difficulties.  3. The patient has been given post  cardiac catheterization      instructions for the particular emphasis on the right groin site      for evidence of bleed or signs of infection.  4. The patient has been advised a new medication, Norvasc 5 mg a day      will be added to his current medication regimen.  TIME SPENT WITH THE PATIENT:  To include physician time:  40 minutes.      Bettey Mare. Lyman Bishop, NP      Everardo Beals. [name], MD, [facility]  Electronically Signed    KML/MEDQ  D:  08/18/2007  T:  08/18/2007  Job:  [id number]   cc:   Jeff Downer, MD

## 2010-11-04 NOTE — Cardiovascular Report (Signed)
NAMERAMESSES, CRAMPTON NO.:  [id number]   MEDICAL RECORD NO.:  [id number]          PATIENT TYPE:  INP   LOCATION:  2927                         FACILITY:  [facility]   PHYSICIAN:  Veverly Fells. Excell Seltzer, MD  DATE OF BIRTH:  Jul 04, 1950   DATE OF PROCEDURE:  08/15/2007  DATE OF DISCHARGE:                            CARDIAC CATHETERIZATION   PROCEDURE:  Left heart catheterization, selective coronary angiography,  left ventricular angiography, PTCA and stenting of the first OM branch  and left circumflex, Angio-Seal of the right femoral artery.   INDICATIONS:  Jeff Moran is a 60 year old gentleman with known CAD.  He  presented to [facility] with ongoing chest pain.  He does have  history of chronic recurrent chest pain, but this is different for him.  At this presentation, he has ruled in for myocardial infarction with  elevated biomarkers.  He has persistent chest discomfort and was  referred urgently for cardiac catheterization.   Risks and indications of procedure were reviewed with the patient.  Informed consent was obtained.  The right groin was prepped, draped,  anesthetized for sent lidocaine using modified Seldinger technique.  A 6-  French sheath was placed in the right femoral artery.  Standard 6-French  Judkins catheters were used for selective coronary angiography, an  angled pigtail catheter was used for left ventriculography.  At  completion of diagnostic procedure, I elected to intervene on the first  OM branch to left circumflex.   The OM branch has subtotal occlusion with TIMI I flow.  It appeared to  be a large vessel.  There is a stent in the OM branch that was widely  patent, but just beyond the stent there was severe stenosis with 99%  stenosis in subtotal occlusion.  Angiomax was used for anticoagulation.  The patient has been on long-term Plavix.  He was given an additional  300 mg of Plavix.  Once a therapeutic ACT was achieved, an XB 3.5  guide  catheter was inserted and a cougar guidewire was passed with a moderate  amount of difficulty beyond the area of severe stenosis into the distal  OM branch.  The lesion was predilated with a 2.0 x 12-mm balloon on two  inflations up to 10 atmospheres.  Following balloon dilatation, there  appeared to be a dissection that I suspect was related to the wire.  I  elected to stent the entire area of segment with 2.75 x 23 mm Promus  stent which was deployed at 16 atmospheres.  The proximal edge of the  stent was overlapped with the distal edge of the previously placed  stent.  With stent inflation, the stent was well deployed and well  expanded.  I elected to post dilate the entire segment with a 3.0 x 20-  mm Quantum Maverick balloon which was taken to 20 atmospheres on two  inflations.  At the completion of the procedure, the stent was widely  patent and there was TIMI III flow in the vessel.  There was a nice step-  down off the distal edge of the stent.  The patient tolerated procedure  well.  The wire and guide catheter were removed and Angio-Seal device  was used to close the femoral arteriotomy.   FINDINGS:  Aortic pressure 118/78 with a mean of 96, left ventricular  pressure 121/9.   CORONARY ANGIOGRAPHY:  Left mainstem:  Left mainstem is widely patent,  bifurcates into the LAD and left circumflex.   LAD:  The LAD is large-caliber vessel that courses down and wraps around  the left ventricular apex.  There is mild calcification of the proximal  LAD.  The first septal perforator has a 70% ostial stenosis.  The first  diagonal has a 70% long segment stenosis in the proximal vessel.  The  LAD itself has diffuse plaque throughout.  There is a large second  diagonal branch and following that branch in the mid-LAD there is a  focal 50-60% stenosis.  The apical portion of the LAD has another 50%  stenosis.  There is no severe stenosis throughout the LAD or diagonal  branches.    Left circumflex:  Left circumflex is codominant.  It is a large vessel.  It courses down and has mild plaque in the proximal and midportions.  The first OM branch is subtotally occluded with TIMI I flow beyond the  area of previously placed stent.  The vessel then courses down and  supplies a left posterolateral branch and left PDA that are widely  patent.   Right coronary artery:  The right coronary artery is codominant with the  left circumflex.  There is a long segment of mild to moderate disease  throughout the proximal midportion creating up to 50% stenosis.  There  is a PDA branch that has no significant obstructive disease.   Left ventriculography demonstrates mild inferior hypokinesis with  overall preserved LV function and an LVEF estimated at 55-60%.   ASSESSMENT:  1. Severe OM1 stenosis with subtotal occlusion.  2. Successful PCI using a Promus drug-eluting stent.  3. Moderate LAD stenosis.  4. Mild to moderate RCA stenosis.  5. Preserved left ventricular systolic function.   PLAN:  Recommend ongoing dual antiplatelet therapy with aspirin and  Plavix for a minimum of 12 months.  Continue with medical therapy for  diffuse CAD.      Veverly Fells. Excell Seltzer, MD  Electronically Signed     MDC/MEDQ  D:  08/15/2007  T:  08/16/2007  Job:  [id number]   cc:   Learta Codding, MD,FACC

## 2010-11-07 NOTE — Cardiovascular Report (Signed)
NAMERALSTON, VENUS NO.:  [id number]   MEDICAL RECORD NO.:  [id number]          PATIENT TYPE:  INP   LOCATION:  4736                         FACILITY:  [facility]   PHYSICIAN:  Charlies Constable, M.D. [facility] DATE OF BIRTH:  1951-04-21   DATE OF PROCEDURE:  06/08/2005  DATE OF DISCHARGE:                              CARDIAC CATHETERIZATION   CLINICAL HISTORY:  Mr. Abdallah is 60 years old and has no prior history of  known heart disease, although he does have hypertension, diabetes, and  hyperlipidemia.  He was admitted to [facility] with chest pain  suggestive of myocardial ischemia.  He had a CT scan to rule out pulmonary  embolus and then was transferred here for further evaluation.  His cardiac  markers were negative.  He did have transient lateral ST elevation.   PROCEDURE:  The procedure was performed via the right femoral artery using  arterial sheath and 6 French preformed coronary catheters.  A front wall  arterial puncture was performed and Omnipaque contrast was used.  At the  completion of the diagnostic study, we made the decision to do intervention  on the ostial lesion of the circumflex marginal vessel.   The patient was given Angiomax bolus and infusion and given 600 mg Plavix  and 20 mg Pepcid.  We used a CLF4 guiding catheter with sideholes and a  Prowater wire.  We crossed the lesion in the ostium of the circumflex  marginal vessel with the wire without difficulty.  We pre-dilated with a 2.5  by 15 mm Maverick performing two inflations of 8 atmospheres for 30 seconds.  We then deployed a 2.75 by 16 mm Taxus stent deploying this with one  inflation of 16 atmospheres for 30 seconds.  We then post dilated with a 3  by 15 mm Quantum Maverick performing one inflation up to 15 atmospheres for  30 seconds.  Final diagnostic study was then performed through the guiding  catheter.  The patient tolerated the procedure well and left the laboratory  in  satisfactory condition.   RESULTS:  Left main coronary artery was free of significant disease.   Left anterior descending artery gave rise to two diagonal branches and two  septal perforators.  There was 30% narrowing in the proximal LAD at the  location of the first large septal perforator.  There was a 40% lesion in  the mid LAD.   Circumflex artery gave rise to a ramus branch, small marginal branch, a  large marginal branch, and posterolateral branch.  There was 90% stenosis in  the proximal portion of the marginal branch extending to the ostium.  There  was 40% narrowing in the mid portion of the marginal branch.   The right coronary artery gave rise to a clonus branch, a right ventricular  branches, a posterior descending branch, and a small posterolateral branch.  There was 50-70% narrowing in the proximal right coronary artery.   The left ventriculogram performed in the RAO projection showed good wall  motion with no areas of hypokinesis.  The estimated ejection fraction was  60%.  The aortic pressure was 102/57 with a mean of 76.  The left ventricular  pressure is 102/11.   Following stenting of the lesion in the ostium of the circumflex marginal  vessel, the stenosis was reduced from 90% to 0%.   CONCLUSION:  1.  Coronary artery disease with 30% of the proximal and 40% mid stenosis in      the left anterior descending artery, 90% stenosis in the ostium of the      circumflex marginal vessel, 50-70% stenosis in the proximal right      coronary artery, and normal left ventricular function.  2.  Successful percutaneous coronary intervention of the ostial lesion in      the circumflex marginal vessel using a Taxus drug eluting stent with      improvement in narrowing from 90% to 0%.   DISPOSITION:  The patient was returned to his room for further observation.  I recommend Plavix for one year, but I think with this vessel, it would be  reasonable to discontinue the Plavix  after one year depending on the  clinical status.           ______________________________  Charlies Constable, M.D. [facility]     BB/MEDQ  D:  06/08/2005  T:  06/09/2005  Job:  [id number]   cc:   Jonelle Sidle, M.D. [facility]  [address]  [place]  [place] [address]

## 2010-11-07 NOTE — Discharge Summary (Signed)
Jeff Moran, Jeff Moran NO.:  [id number]   MEDICAL RECORD NO.:  [id number]          PATIENT TYPE:  INP   LOCATION:  2027                         FACILITY:  [facility]   PHYSICIAN:  Jonelle Sidle, M.D. LHCDATE OF BIRTH:  05-04-1951   DATE OF ADMISSION:  09/21/2005  DATE OF DISCHARGE:  09/24/2005                                 DISCHARGE SUMMARY   PROCEDURES:  2-D echo cardiogram.   PRIMARY DIAGNOSIS:  Chest pain, improved with twice a day proton pump  inhibitor and felt primarily secondary to reflux, cardiac enzymes negative  for myocardial infarction, and chest CT negative for pulmonary embolus or  dissection.   SECONDARY DIAGNOSIS:  1.  Status post cardiac catheterization in December 2006 with a 90%      circumflex reduced to 0 with a TAXUS stent and non-obstructive disease      in the LAD and RCA, EF 60%.  2.  Diabetes.  3.  Hypertension.  4.  Hyperlipidemia.  5.  Asthmatic bronchitis.  6.  Gastroesophageal reflux disease.  7.  Anxiety/post-traumatic stress disorder.  8.  Cough productive of greenish sputum.   TIME AT DISCHARGE:  38 minutes.   HOSPITAL COURSE:  Jeff Moran is a 60 year old male with a history of coronary  artery disease.  Since January 2007, he has had continuous chest pain.  His  symptoms are improved by sublingual nitroglycerin and do not change with  deep inspiration, position or cough.  He came to the emergency room after  using multiple nebulizers without relief.  He was admitted for further  evaluation and treatment.   His cardiac enzymes were negative, although his CK was elevated at greater  than 1500.  The MB was mildly elevated at 10, but the index was within  normal limits and all troponins were negative.  His BNP was 48 and his D-  dimer was within normal limits at 0.22.  It was not felt that this was a  primary cardiac chest pain.   He has some chronic cough and chronic dyspnea.  His BNP was normal.  His  chest x-ray  showed bibasilar atelectasis, but no acute disease or  infiltrate.  A CT was performed which showed no evidence of pulmonary emboli  or aortic defect dissection.  He had mild peribronchial thickening and  bronchitis of uncertain chronicity as well as fatty infiltration of the  liver.  A pulmonary consult was called.   Jeff Moran had been initially scheduled to see Dr. Elesa Massed on April 12, but Dr.  Craige Cotta saw him in the hospital.  Dr. Craige Cotta agreed with twice a day proton pump  inhibitor.  He felt that a rapid taper of prednisone was indicated and  antibiotics were not indicated at this time because the sputum culture did  not show significant bacterial growth.  He is cleared to use a rescue  albuterol inhaler, but is not to use nebulizers.  He is to follow up with  pulmonary function testing including spirometry, lung volumes and diffusion  capacity to further evaluate his respiratory status.  He is  also to get a  sleep study as an outpatient as well and follow up with Dr. __________.  Of  note, his rheumatoid factor was significantly elevated at 152 and Jeff Moran  should follow-up with his primary care physician for this.  He had been on  Naprosyn prior to admission, and this was discontinued.  He had  significantly elevated cholesterol at 254 with triglycerides 470, HDL 42 and  LDL not calculated.  He will follow up with Dr. Andee Lineman in the office for  this.   Dr. Diona Browner evaluated Jeff Moran on 09/24/2005.  His meds were reviewed and  changes explained.  He is considered stable for discharge on 09/24/2005  without patient follow-up arranged.   DISCHARGE INSTRUCTIONS:  1.  His activity level should be increased slowly.  He is to stick to a diet      that is low in fat and salt.  2.  He is to follow up with Dr. Andee Lineman on May 18 at 1:50, and he is to      follow up with Dr. Craige Cotta and the office will call.  3.  He is to follow up with Dr. [name] and call for an appointment.   DISCHARGE  MEDICATIONS:  1.  Prednisone 10 mg tablets, tapered down by one every 3 days and then      discontinue.  2.  Protonix 40 mg twice a day.  3.  Mucinex 600 mg, two tabs twice a day for a week and then stop.  4.  Xanax 0.25 mg three times a day as needed.  5.  He is not to take Naprosyn or atenolol.  6.  Amaryl 2 mg a day.  7.  Plavix 75 mg a day.  8.  Aspirin 325 mg a day.  9.  HCTZ 25 mg a day.  10. Paxil 20 mg a day.  11. Simvastatin 80 mg a day.  12. Clonidine 0.1 mg twice a day.  13. Albuterol MDI twice a day as needed.      Theodore Demark, P.A. [facility]    ______________________________  Jonelle Sidle, M.D. [facility]    RB/MEDQ  D:  09/24/2005  T:  09/25/2005  Job:  [id number]   cc:   Dr. Charlie Pitter, M.D.   [facility]  [place], [place]

## 2010-11-07 NOTE — Discharge Summary (Signed)
NAMEMICHOEL, KUNIN NO.:  [id number]   MEDICAL RECORD NO.:  [id number]          PATIENT TYPE:  OIB   LOCATION:  4703                         FACILITY:  [facility]   PHYSICIAN:  Micheline Chapman, MD   DATE OF BIRTH:  1950-09-28   DATE OF ADMISSION:  01/20/2006  DATE OF DISCHARGE:  01/21/2006                                 DISCHARGE SUMMARY   PROCEDURES:  1.  Cardiac catheterization.  2.  Coronary arteriogram.  3.  Left ventriculogram.   DISCHARGE DIAGNOSES:  1.  Chest pain, nonobstructive disease by catheter this admission.  2.  Preserved left ventricular function with an ejection fraction of 60% and      mildly increased left ventricular end-diastolic pressures  3.  Status post Taxus stent to the circumflex in December 2006.  4.  Diabetes.  5.  Obstructive sleep apnea, on continuous positive airway pressure.  6.  Chronic cough, possibly reflux versus obstructive sleep apnea, followed      by Pulmonary.  7.  Possible asthma.  8.  Dyslipidemia.  9.  History of traumatic stress disorder.  10. History of chronically elevated CKs with a CPK of 856 this admission at      [facility].  11. 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  12. Status post crush injury to his left hand in 2002.  13  History of right hand surgery in 2000.  14  History of skull fracture x2.  1.  History of hepatitis B.   TIME OF DISCHARGE:  Time of discharge was 41 minutes.   HOSPITAL COURSE:  Mr. Welker is a 60 year old male with a history of coronary  artery disease who also has had frequent episodes of chest pain.  His  cardiac risk factors include hypertension, diabetes and hyperlipidemia.  He  has had several admissions for chest pain, the most recent being in May  2007, when it was felt that he could be continued on medical therapy.  His  last stress test was in December 2006 and was without ischemia.  He was  complaining of substernal chest pain that started at rest.  He  went to  [facility] where he was evaluated by Dr. Oletta Lamas.  It was felt that he  needed further evaluation and catheterization, and he was transferred to  [facility].   His cardiac enzymes were negative for MI.  A cardiac catheterization was  performed on 01/20/2006 and showed multiple nonobstructive lesions.  There  was a 70% lesion and a small diagonal and between 30-40% lesions in the LAD,  OM and RCA.  There was no end-stent restenosis.  His EF was 60%.   Dr. Gala Romney evaluated the films and felt that he had chronic chest pain  with no critical coronary artery disease by cath this admission.  It was  felt that he had diastolic dysfunction because of his mildly increased left  ventricular end-diastolic pressure.  Dr. Gala Romney felt that the chest pain  was nonischemic and aggressive medical therapy with risk-factor reduction  was indicated.   On  01/21/2006, Mr. Karapetian' chest pain had improved.  His postprocedure labs  were within normal limits.  He was evaluated by Dr. Excell Seltzer and was  considered stable for discharge with outpatient followup arranged.   DISCHARGE INSTRUCTIONS:  1.  His activity level is to be reduced for the next few days.  2.  He is to stick to a low-fat diabetic diet.  3.  He is to call our office for any problems with the cath site.  4.  He is to follow up with Dr. [name] for noncardiac causes for chest pain,      and he is to follow up with Dr. Andee Lineman on August 21 at 12:15.   DISCHARGE MEDICATIONS:  1.  Atenolol 50 mg a day.  2.  Clonidine 0.2 mg b.i.d.  3.  HCTZ 25 mg a day.  4.  Lasix 40 mg a day  5.  Prednisone taper, continue as directed.  6.  __________ 4 mg a day.  7.  Paxil 20 mg a day.  8.  Simvastatin 8 mg a day.  9.  Protonix 40 mg a day.  10. Aspirin 325 mg a day.  11. Albuterol MDI q.i.d.  12. Nitroglycerin sublingual p.r.n.      Theodore Demark, P.A. [facility]      Micheline Chapman, MD  Electronically Signed    RB/MEDQ  D:   01/21/2006  T:  01/21/2006  Job:  [id number]   cc:   [facility]  [facility]

## 2010-11-07 NOTE — Discharge Summary (Signed)
[name], [name]:  [id number]   MEDICAL RECORD NO.:  [id number]          PATIENT TYPE:  INP   LOCATION:  6531                         FACILITY:  [facility]   PHYSICIAN:  Dorian Pod, NP    DATE OF BIRTH:  03/22/1951   DATE OF ADMISSION:  06/06/2005  DATE OF DISCHARGE:  06/09/2005                                 DISCHARGE SUMMARY   PRIMARY CARE PHYSICIAN:  Dr. Gwenyth Bouillon __________.   CARDIOLOGIST:  Dr. Simona Huh.   DISCHARGING DIAGNOSES:  1.  Coronary artery disease/chest pain status post cardiac catheterization      with drug eluting stent circumflex to the obtuse marginal 1 with an      ejection fraction of 60%.  2.  Type 2 diabetes.  3.  Hyperlipidemia.  4.  Gastroesophageal reflux disease.  5.  Anxiety/posttraumatic stress disorder.   HOSPITAL COURSE:  [name] is a 60 year old African-American gentleman with  longstanding history of hypertension, diabetes, hyperlipidemia, GERD,  anxiety and panic attacks status post [place] veteran, who presented to  [facility] on June 05, 2005 complaining of chest pain.  [name] had no known history of coronary artery disease.  He did state,  however, that he underwent a cardiac evaluation at [facility] back in  1998, at which time he had a catheterization that reportedly showed no  problems per patient.  He was also told by the [facility] that he had a  thick heart muscle apparently by echocardiogram.  However, he has not had  any cardiac testing for several years.  On day of admission, he complained  of sudden onset of squeezing tightness in his chest as if an elephant was  sitting on it.  He was treated with nitroglycerine, aspirin, and Morphine  with resolution of his symptoms.  EKG showed largely non-specific ST changes  with a slight ST elevation inferior laterally.  His blood work initially  showed a fairly significant elevated total CK level at 1,053 although a  normal  CK-MB of 5.5 and a troponin of 0.03.  The patient was evaluated by  Dr. Nona Dell at [facility].  He discussed the situation with the  patient and his wife and it was felt that patient needed a further  evaluation and was arranged for patient to transfer to [facility]  for further evaluation and possible cardiac catheterization.  The patient  was taken to the cath lab on June 08, 2005 by Dr. Charlies Constable.  The  patient tolerated the procedure without complications, was noted to have  hypoglycemic event on the evening after his catheterization, resolved with  instant glucose and graham crackers.  Adjustments were made and patient's  Amaryl dose.   LABORATORY WORK THIS ADMISSION:  Total cholesterol of 202, triglycerides  250, HDL of 39, and an LDL of 113, white blood cell count 8.4, hemoglobin  13.2, hematocrit 38.6 with a platelet count of 285,000, sodium 136,  potassium 3.5, BUN 5, creatinine 1.1 with a glucose of 133.  Patient  afebrile.  Blood pressure 142/68.   Dr. Michelle Piper  DeGent in to see patient prior to discharge.  Patient being  discharged home to follow-up with Dr. Diona Browner.  I have made an appointment  at [facility] for July 03, 2005 at 2:30 p.m.  He is to call  our office for any problems from cath site prior to this.  He also has been  given the cardiac catheterization discharge instructions.  He is to follow a  low-fat, low-salt  diabetic diet.   MEDICATIONS AT DISCHARGE:  1.  Amaryl decreased to 2 mg daily.  2.  Plavix 75 mg daily x1 year.  3.  Aspirin 325 mg daily.  4.  Atenolol 50 mg daily.  5.  HCTZ 25 mg daily.  6.  Paxil 20 mg daily.  7.  Simvastatin 80 mg at bedtime.  8.  Clonidine 0.1 mg p.o. b.i.d.  9.  The patient has also been supplemented with some p.o. potassium prior to      discharge.   DURATION OF DISCHARGE:  Thirty minutes.      Dorian Pod, NP     MB/MEDQ  D:  06/09/2005  T:  06/10/2005  Job:  [id number]   cc:    Jonelle Sidle, M.D. [facility]  [address]  [place]  [place] [address]   Amwir?

## 2010-11-07 NOTE — Procedures (Signed)
Jeff Moran, Jeff Moran NO.:  [id number]   MEDICAL RECORD NO.:  [id number]          PATIENT TYPE:  OUT   LOCATION:  SLEEP CENTER                  FACILITY:  [facility]   PHYSICIAN:  Coralyn Helling, M.D.      DATE OF BIRTH:  Sep 15, 1950   DATE OF STUDY:  10/19/2005                              NOCTURNAL POLYSOMNOGRAM   REFERRING PHYSICIAN:  Coralyn Helling, M.D.   INDICATIONS FOR STUDY:  Individual who has witnessed apnea.  Waking up with  choking sensation and restless sleep and referred to the sleep lab for  evaluation of sleep disordered breathing.   EPSWORTH SCORE:  3.   MEDICATIONS:  Hydrochlorothiazide, Protonix, simvastatin, lisinopril. The  patient had taken a pain medication prior to beginning of study.   SLEEP ARCHITECTURE:  Total recording time was 395 minutes, total sleep time  was 185 minutes.  Sleep efficiency was 47% which is reduced, sleep latency  was 104 minutes which was prolonged, REM latency was 122 minutes which was  prolonged.   The patient had difficulty with sleep initiation due to frequent arousals  related to respiratory events.  However, these events particularly were not  scorable.  The study was notable for the lack of slow wave sleep and a  significant reduction in REM sleep to 2% of the study.  The patient slept in  both the supine and nonsupine position.   RESPIRATORY DATA:  The average respiratory rate was 18, the apnea/hypopnea  index was 13, the respiratory events were exclusively obstructive in nature.  Loud snoring noted by the technician.   OXYGEN DATA:  Oxygen saturation nadir was 84%, the baseline oxygenation was  95%.  The patient spent total of 370.3 minutes with an oxygen saturation  between 91 and 100%  and 13.5 minutes with oxygen saturation between 81 and  90% .   CARDIAC DATA:  EKG showed normal sinus rhythm with sinus bradycardia and  average heart rate of 52.   MOVEMENT PARASOMNIA:  Periodic limb movement index was  15.9.  There was also  evidence for intermittent episodes of bruxism in the early part of the  study.   IMPRESSION:  This study shows evidence for mild obstructive sleep apnea as  demonstrated by an apnea/hypopnea index of 13, oxygen saturation nadir of  84%.  Of note is that the patient had a significant reduction in REM sleep  and therefore the severity of sleep apnea may be underestimated.  Also of  note is that the patient had difficulty with sleep initiation which appeared  to be related to arousals from respiratory events and this may be an  explanation for symptoms of insomnia.  Intermittent episodes of bruxism were  also noted and he had a slight elevation in his  periodic limb movement index; however, clinical correlation would be  necessary to determine the significance of this.      Coralyn Helling, M.D.  Diplomat, Biomedical engineer of Sleep Medicine  Electronically Signed     VS/MEDQ  D:  11/03/2005 17:23:43  T:  11/04/2005 09:57:33  Job:  [id number]

## 2010-11-07 NOTE — Cardiovascular Report (Signed)
[name], [name] NO.:  [id number]   MEDICAL RECORD NO.:  [id number]          PATIENT TYPE:  OIB   LOCATION:  4703                         FACILITY:  [facility]   PHYSICIAN:  Jeff Moran, M.D. LHCDATE OF BIRTH:  04-Sep-1950   DATE OF PROCEDURE:  01/20/2006  DATE OF DISCHARGE:                              CARDIAC CATHETERIZATION   PRIMARY CARE PHYSICIAN:  Jeff Moran.   CARDIOLOGY:  Jeff Moran, M.D.   PATIENT IDENTIFICATION:  Jeff Moran is a 60 year old man with history of  diabetes and known coronary artery disease status post previous stenting of  his OM-2.  He has had essentially chronic chest pain. He has had multiple  admissions for this, all with negative enzymes and negative EKGs.  Given his  ongoing pain, he was referred for diagnostic catheterization.  Of note, the  patient continued have 5-6/10 chest pain throughout the entire  catheterization and requesting narcotics.   PROCEDURES PERFORMED:  1.  Selective coronary angiography.  2.  Left heart catheterization.  3.  Left ventriculogram.  4.  Angio-Seal closure device.   DESCRIPTION OF PROCEDURE:  The risks and benefits of catheterization were  explained.  Consent was signed and placed on the chart.  A 6-French arterial  sheath was placed in the right femoral artery using a modified Seldinger  technique.  Standard catheters including JL-4, JR-4 and angled pigtail were  used for the procedure. All catheter exchanges made over wire.  There were  no apparent complications.   Central aortic pressure 130/76 with a mean of 97.  LV pressure was 148/9  with LVEDP of 920.  There was no aortic stenosis.   Left main was normal.   LAD was a long vessel coursing to the apex.  It gave off a small first  diagonal and medium size second diagonal.  There was a 40% lesion in the  proximal LAD and a 40-50% lesion in the mid-LAD.  In the proximal portion of  a small diagonal, there was a 70% lesion.   Left  circumflex was a large codominant system.  It gave off a small OM-1, a  large OM-2, a small posterolateral, and a small to moderate PDA.  There was  a previously placed stent in the proximal portion of the OM-2, and this was  widely patent.  Just after the stent, there was about 30% focal stenosis.   Right coronary artery was a large codominant vessel.  It gave off an RV  branch, a large branching PDA and a moderate-sized posterolateral.  There  was a 40-50% lesion proximally as well as a 40% lesion more distally around  the bend.  This lesion had some streaming associated with it and did not  appear flow limiting.   Left ventriculogram done in the RAO position showed an EF of 60% with no  wall motion abnormalities or significant mitral regurgitation.   ASSESSMENT:  1.  Nonobstructive three-vessel coronary disease with borderline lesion in a      small first diagonal.  The distal right coronary artery also has a 40%  lesion with some streaming but does not appear flow limiting.  2.  Normal left ventricular function with mildly increased left ventricular      end-diastolic pressure  consistent with diastolic dysfunction.  3.  Chronic chest pain.   PLAN/DISCUSSION:  I have at this point, I doubt Jeff Moran' chest pain is  ischemic.  I have reviewed the catheterization films with Jeff Moran with  particular attention to the distal right coronary artery.  He agrees that  there does not appear to be any significant lesion to explain his resting  chest pain.  We will observe him overnight and discharge him home in the  morning.      Jeff Moran, M.D. [facility]  Electronically Signed     DB/MEDQ  D:  01/20/2006  T:  01/20/2006  Job:  [id number]   cc:   Jeff Moran, M.D. [facility]

## 2010-11-07 NOTE — H&P (Signed)
NAMEJAZPER, Jeff Moran NO.:  [id number]   MEDICAL RECORD NO.:  [id number]          PATIENT TYPE:  INP   LOCATION:  2027                         FACILITY:  [facility]   PHYSICIAN:  Vida Roller, M.D.   DATE OF BIRTH:  [birth year]   DATE OF ADMISSION:  09/21/2005  DATE OF DISCHARGE:                                HISTORY & PHYSICAL   PRIMARY CARE PHYSICIAN:  Dr. [name]   PRIMARY CARDIOLOGIST:  Dr. Simona Huh   CHIEF COMPLAINT:  Chest pain.   HISTORY OF PRESENT ILLNESS:  [name] is a 60 year old male with a history  of coronary artery disease.  He had percutaneous intervention to the  circumflex in December of 2006.  In January of 2007 he developed left-sided  chest pain.  He says it is continuous.  He describes it as a pulling or  tearing sensation.  It radiates over into the middle of his chest.  It  reaches an 8/10 and improves with nitroglycerin.  He has never tried more  than one nitroglycerin at a time and takes nitroglycerin on a daily basis.  He has also recently had problems with orthopnea, paroxysmal nocturnal  dyspnea, edema, and over time has developed shortness of breath at rest  which he says is presyncope when he walks around.  Yesterday his symptoms  became worse and he used multiple nebulizers.  He said he went through an  entire case of nebulizers without relief.  Through his primary care  physician an appointment was made for him to see Dr. Sherene Sires on April 12;  however, he said he could not wait that long.  He also stated he has been on  multiple antibiotics in the last three months including two Z-Paks, Cipro,  and amoxicillin.  He continues to have a productive cough and significant  wheezing.  He has also been on steroids in the past for this, but not  recently.  Prior to the heart catheterization he was not having problems  with wheezing and has no previous diagnosis of COPD or asthma.   PAST MEDICAL HISTORY:  1.  Status post cardiac  catheterization in December of 2006 with the LAD 30-      40%, RCA 50-70%, circumflex 90% reduced to 0% with a TAXUS stent and EF      60%.  2.  Diabetes.  3.  Hypertension.  4.  Hyperlipidemia.  5.  Gastroesophageal reflux disease.  6.  History of anxiety/PTSD.  7.  Recent diagnosis of asthma.   SURGICAL HISTORY:  He has had bilateral hand surgery with two surgeries on  the left hand as well as right arm surgery and heart catheterization.   ALLERGIES:  No known drug allergies.   CURRENT MEDICATIONS:  1.  Albuterol nebulizers q.i.d.  2.  Talwin NX 50/0.5 mg b.i.d.  3.  Plavix 75 mg a day.  4.  Aspirin 325 mg a day.  5.  Atenolol 50 mg a day.  6.  Clonidine 0.1 mg b.i.d.  7.  HCTZ 25 mg a day.  8.  Naprosyn 500 mg  b.i.d.  9.  Paxil 50 mg a day.  10. Amaryl 2 mg a day.  11. Tussionex 5 mL q.12h.  12. Mucinex 600 mg two tablets b.i.d.  13. Albuterol MDI b.i.d.   SOCIAL HISTORY:  He lives in __________, [place] with his wife.  He is  retired from the [employer].  He has no history of alcohol, tobacco, or  drug abuse.   FAMILY HISTORY:  His mother is alive at age 40 without a history of heart  disease and his father died at age 57 without heart disease.  He has no  siblings with coronary artery disease.   REVIEW OF SYSTEMS:  He says that he believes he has had fevers recently and  has felt very hot and sweating followed by chills but he has not taken his  temperature.  Chest pain is described above.  He does complain of some  palpitations as well as coughing and wheezing.  He has noticed some  frequency and some stress incontinence.  He has some problems with anxiety.  He has arthralgias.  He has had some dysphagia and reflux symptoms.  Review  of systems is otherwise negative.   PHYSICAL EXAMINATION:  VITAL SIGNS:  Temperature 97.6, blood pressure  161/109, pulse 64, respiratory rate 26, O2 saturation 96% on 2 L.  GENERAL:  He is a well-developed African-American male  who coughs  frequently.  HEENT:  His head is normocephalic, atraumatic with pupils are equal, round,  and reactive to light and accommodation.  Extraocular movements intact.  NECK:  There is no lymphadenopathy, thyromegaly, bruit, or JVD noted.  CARDIOVASCULAR:  His heart is regular in rate and rhythm with an S1, S2, and  no significant murmur, rub, or gallop is noted.  Distal pulses are 2+ and no  femoral bruits are appreciated.  LUNGS:  He has inspiratory and expiratory wheeze and some rales, but no  crackles and very few rhonchi are noted.  SKIN:  No rashes or lesions are noted.  ABDOMEN:  Soft and nontender with active bowel sounds.  EXTREMITIES:  He has no clubbing, cyanosis, edema noted.  MUSCULOSKELETAL:  There is no joint deformity or effusion and no spine or  CVA tenderness.  NEUROLOGIC:  He is alert and oriented.  Cranial nerves II-XII grossly  intact.   Chest x-ray (PA and lateral):  His lungs are under inflated with bibasilar  atelectasis.  Prominent paraspinal mass on previous chest x-ray is not well  seen and CT is suggested to clarify.   EKG:  Rate 61, sinus rhythm with questionable LVH and inferior Q-waves of  uncertain significance.   LABORATORIES:  Hemoglobin 14, hematocrit 41.3, WBC 9.2, platelets 224.  Sodium 135, potassium 3.5, chloride 99, CO2 28, BUN 16, creatinine 1.2,  glucose 111.  Point of care markers:  #1 myoglobin 474, MB 11.8, troponin  less than 0.05.  #2 myoglobin 483, MB 8.6, troponin less than 0.05.   Chest pain:  Continue nitroglycerin IV as this improves his pain.  The only  cardiac enzymes available are point of care markers and these are mildly  abnormal but his troponins are negative and we have no total CK and  therefore no index.  Examination shows wheezing, but no other signs of CHF.  His cough is productive of greenish sputum which has been sent to the laboratory.  We will check a BNP and a D-dimer.  A chest CT will be  performed and  will be performed with  contrast if the D-dimer is elevated and  without contrast if it is normal.  An echocardiogram will be ordered as  well.  Because his sputum is greenish in color we will order empiric  antibiotics of Levaquin and azithromycin.  We will follow up on the results  of the sputum culture and ask pulmonary to see in a.m.  Will discontinue the  beta blocker and add b.i.d. proton-pump inhibitor as well.  Will ask  pulmonary to see in a.m. and further evaluation and treatment will depend on  their recommendations.  Will also increase his nebulizers to triple therapy  with albuterol, Atrovent, and Pulmicort.   Mr. Mirarchi will be continued on his other medications and will recheck a  lipid profile and hemoglobin A1c.      Theodore Demark, P.A. [facility]      Vida Roller, M.D.  Electronically Signed    RB/MEDQ  D:  09/21/2005  T:  09/22/2005  Job:  [id number]

## 2011-03-13 LAB — CBC
Hemoglobin: 14.3
MCHC: 33.2
MCHC: 33.3
MCV: 80.5
MCV: 80.9
Platelets: 231
RBC: 5.35
WBC: 8.7

## 2011-03-13 LAB — CARDIAC PANEL(CRET KIN+CKTOT+MB+TROPI)
CK, MB: 101.6 — ABNORMAL HIGH
Relative Index: 5.9 — ABNORMAL HIGH
Total CK: 1827 — ABNORMAL HIGH
Troponin I: 17.3
Troponin I: 22.36

## 2011-03-13 LAB — BASIC METABOLIC PANEL
BUN: 5 — ABNORMAL LOW
CO2: 27
CO2: 28
Chloride: 101
Chloride: 96
Chloride: 99
Creatinine, Ser: 1.01
GFR calc Af Amer: >60
GFR calc Af Amer: >60
Glucose, Bld: 141 — ABNORMAL HIGH
Potassium: 3.9
Potassium: 4
Sodium: 133 — ABNORMAL LOW
Sodium: 137

## 2011-03-13 LAB — LIPID PANEL
Cholesterol: 208 — ABNORMAL HIGH
Total CHOL/HDL Ratio: 5.5
Triglycerides: 342 — ABNORMAL HIGH
VLDL: 53 — ABNORMAL HIGH

## 2011-03-13 LAB — TSH: TSH: 0.394

## 2012-05-06 IMAGING — CR DG LUMBAR SPINE 2-3V
1 series · 1 of 1 positions shown · non-contrast
Comparison: 02/25/2010

CLINICAL DATA: Lumbar stenosis

LUMBAR SPINE - 2-3 VIEW

[view not recorded]
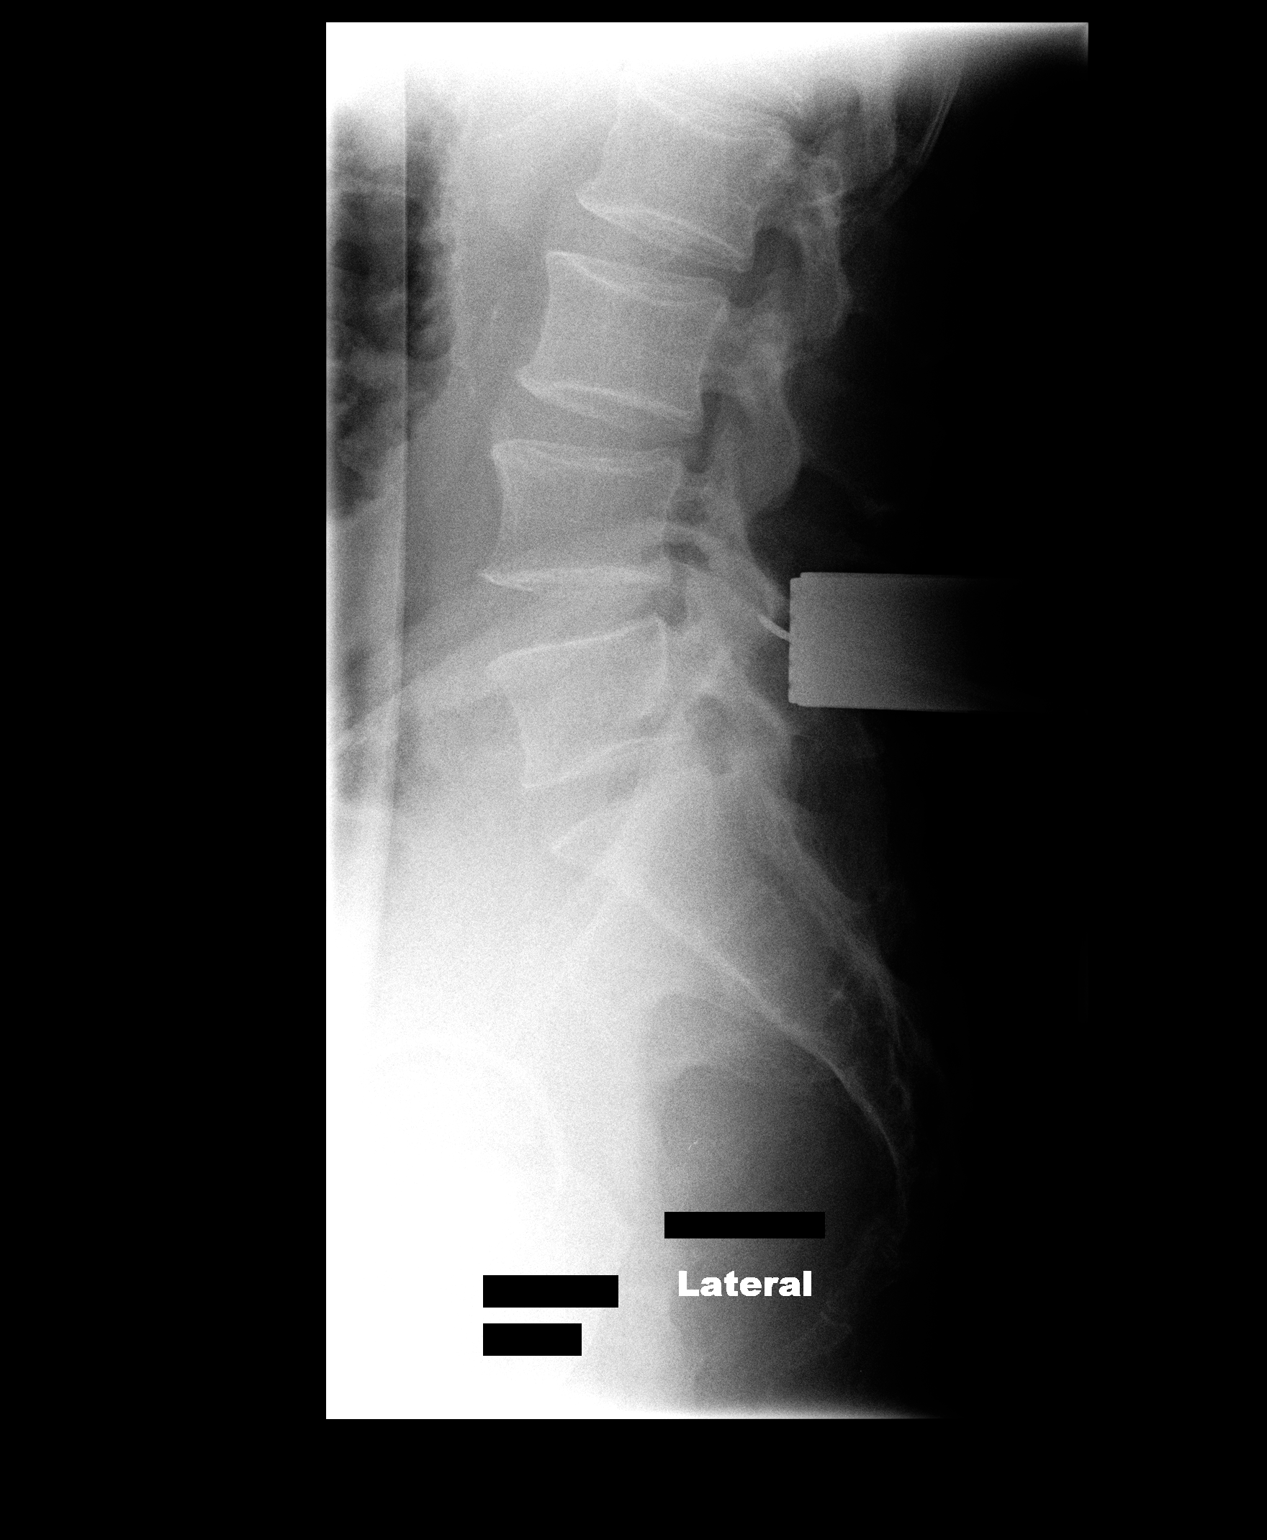

[1 of 1 positions shown; findings below may reference images not displayed]

FINDINGS: On the initial film, a needle has been utilized to mark
the L4-5 interspinous ligament.  On the second film, a surgical
instrument projects over the L4 inferior articular facet.
IMPRESSION: Intraoperative marking at L4-5.

## 2016-07-27 DIAGNOSIS — H16223 Keratoconjunctivitis sicca, not specified as Sjogren's, bilateral: Secondary | ICD-10-CM | POA: Diagnosis not present

## 2016-07-27 DIAGNOSIS — I10 Essential (primary) hypertension: Secondary | ICD-10-CM | POA: Diagnosis not present

## 2016-07-27 DIAGNOSIS — H2513 Age-related nuclear cataract, bilateral: Secondary | ICD-10-CM | POA: Diagnosis not present

## 2016-07-27 DIAGNOSIS — H40013 Open angle with borderline findings, low risk, bilateral: Secondary | ICD-10-CM | POA: Diagnosis not present

## 2016-07-27 DIAGNOSIS — H524 Presbyopia: Secondary | ICD-10-CM | POA: Diagnosis not present

## 2016-07-27 DIAGNOSIS — E119 Type 2 diabetes mellitus without complications: Secondary | ICD-10-CM | POA: Diagnosis not present

## 2016-07-27 DIAGNOSIS — H04123 Dry eye syndrome of bilateral lacrimal glands: Secondary | ICD-10-CM | POA: Diagnosis not present

## 2016-07-27 DIAGNOSIS — H40001 Preglaucoma, unspecified, right eye: Secondary | ICD-10-CM | POA: Diagnosis not present

## 2016-07-27 DIAGNOSIS — H3589 Other specified retinal disorders: Secondary | ICD-10-CM | POA: Diagnosis not present

## 2016-07-27 DIAGNOSIS — H35033 Hypertensive retinopathy, bilateral: Secondary | ICD-10-CM | POA: Diagnosis not present

## 2016-07-27 DIAGNOSIS — H35031 Hypertensive retinopathy, right eye: Secondary | ICD-10-CM | POA: Diagnosis not present

## 2016-07-27 DIAGNOSIS — H35461 Secondary vitreoretinal degeneration, right eye: Secondary | ICD-10-CM | POA: Diagnosis not present

## 2016-08-25 DIAGNOSIS — H2513 Age-related nuclear cataract, bilateral: Secondary | ICD-10-CM | POA: Diagnosis not present

## 2016-08-25 DIAGNOSIS — H16223 Keratoconjunctivitis sicca, not specified as Sjogren's, bilateral: Secondary | ICD-10-CM | POA: Diagnosis not present

## 2016-08-25 DIAGNOSIS — H04123 Dry eye syndrome of bilateral lacrimal glands: Secondary | ICD-10-CM | POA: Diagnosis not present

## 2016-08-25 DIAGNOSIS — H40013 Open angle with borderline findings, low risk, bilateral: Secondary | ICD-10-CM | POA: Diagnosis not present

## 2016-09-04 DIAGNOSIS — J209 Acute bronchitis, unspecified: Secondary | ICD-10-CM | POA: Diagnosis not present

## 2016-09-04 DIAGNOSIS — J069 Acute upper respiratory infection, unspecified: Secondary | ICD-10-CM | POA: Diagnosis not present

## 2016-09-11 DIAGNOSIS — I10 Essential (primary) hypertension: Secondary | ICD-10-CM | POA: Diagnosis not present

## 2016-09-11 DIAGNOSIS — E782 Mixed hyperlipidemia: Secondary | ICD-10-CM | POA: Diagnosis not present

## 2016-09-11 DIAGNOSIS — I2510 Atherosclerotic heart disease of native coronary artery without angina pectoris: Secondary | ICD-10-CM | POA: Diagnosis not present

## 2016-09-14 DIAGNOSIS — R0789 Other chest pain: Secondary | ICD-10-CM | POA: Diagnosis not present

## 2016-09-14 DIAGNOSIS — N281 Cyst of kidney, acquired: Secondary | ICD-10-CM | POA: Diagnosis not present

## 2016-09-14 DIAGNOSIS — R0602 Shortness of breath: Secondary | ICD-10-CM | POA: Diagnosis not present

## 2016-09-14 DIAGNOSIS — I209 Angina pectoris, unspecified: Secondary | ICD-10-CM | POA: Diagnosis not present

## 2016-09-14 DIAGNOSIS — E118 Type 2 diabetes mellitus with unspecified complications: Secondary | ICD-10-CM | POA: Diagnosis not present

## 2016-09-14 DIAGNOSIS — M6282 Rhabdomyolysis: Secondary | ICD-10-CM | POA: Diagnosis not present

## 2016-09-14 DIAGNOSIS — R079 Chest pain, unspecified: Secondary | ICD-10-CM | POA: Diagnosis not present

## 2016-09-15 DIAGNOSIS — E7800 Pure hypercholesterolemia, unspecified: Secondary | ICD-10-CM | POA: Diagnosis present

## 2016-09-15 DIAGNOSIS — I209 Angina pectoris, unspecified: Secondary | ICD-10-CM | POA: Diagnosis not present

## 2016-09-15 DIAGNOSIS — Z7951 Long term (current) use of inhaled steroids: Secondary | ICD-10-CM | POA: Diagnosis not present

## 2016-09-15 DIAGNOSIS — K219 Gastro-esophageal reflux disease without esophagitis: Secondary | ICD-10-CM | POA: Diagnosis present

## 2016-09-15 DIAGNOSIS — I10 Essential (primary) hypertension: Secondary | ICD-10-CM | POA: Diagnosis present

## 2016-09-15 DIAGNOSIS — Z8042 Family history of malignant neoplasm of prostate: Secondary | ICD-10-CM | POA: Diagnosis not present

## 2016-09-15 DIAGNOSIS — E785 Hyperlipidemia, unspecified: Secondary | ICD-10-CM | POA: Diagnosis present

## 2016-09-15 DIAGNOSIS — I2510 Atherosclerotic heart disease of native coronary artery without angina pectoris: Secondary | ICD-10-CM | POA: Diagnosis present

## 2016-09-15 DIAGNOSIS — E118 Type 2 diabetes mellitus with unspecified complications: Secondary | ICD-10-CM | POA: Diagnosis not present

## 2016-09-15 DIAGNOSIS — K449 Diaphragmatic hernia without obstruction or gangrene: Secondary | ICD-10-CM | POA: Diagnosis present

## 2016-09-15 DIAGNOSIS — Z833 Family history of diabetes mellitus: Secondary | ICD-10-CM | POA: Diagnosis not present

## 2016-09-15 DIAGNOSIS — J189 Pneumonia, unspecified organism: Secondary | ICD-10-CM | POA: Diagnosis present

## 2016-09-15 DIAGNOSIS — M1009 Idiopathic gout, multiple sites: Secondary | ICD-10-CM | POA: Diagnosis not present

## 2016-09-15 DIAGNOSIS — E109 Type 1 diabetes mellitus without complications: Secondary | ICD-10-CM | POA: Diagnosis present

## 2016-09-15 DIAGNOSIS — J168 Pneumonia due to other specified infectious organisms: Secondary | ICD-10-CM | POA: Diagnosis not present

## 2016-09-15 DIAGNOSIS — Z7982 Long term (current) use of aspirin: Secondary | ICD-10-CM | POA: Diagnosis not present

## 2016-09-15 DIAGNOSIS — M109 Gout, unspecified: Secondary | ICD-10-CM | POA: Diagnosis not present

## 2016-09-15 DIAGNOSIS — N281 Cyst of kidney, acquired: Secondary | ICD-10-CM | POA: Diagnosis not present

## 2016-09-15 DIAGNOSIS — Z79899 Other long term (current) drug therapy: Secondary | ICD-10-CM | POA: Diagnosis not present

## 2016-09-15 DIAGNOSIS — R0789 Other chest pain: Secondary | ICD-10-CM | POA: Diagnosis not present

## 2016-09-15 DIAGNOSIS — M6282 Rhabdomyolysis: Secondary | ICD-10-CM | POA: Diagnosis not present

## 2016-09-15 DIAGNOSIS — I252 Old myocardial infarction: Secondary | ICD-10-CM | POA: Diagnosis not present

## 2016-09-15 DIAGNOSIS — R0602 Shortness of breath: Secondary | ICD-10-CM | POA: Diagnosis not present

## 2016-09-15 DIAGNOSIS — J440 Chronic obstructive pulmonary disease with acute lower respiratory infection: Secondary | ICD-10-CM | POA: Diagnosis present

## 2016-09-15 DIAGNOSIS — Z951 Presence of aortocoronary bypass graft: Secondary | ICD-10-CM | POA: Diagnosis not present

## 2016-09-15 DIAGNOSIS — F419 Anxiety disorder, unspecified: Secondary | ICD-10-CM | POA: Diagnosis present

## 2016-09-15 DIAGNOSIS — Z794 Long term (current) use of insulin: Secondary | ICD-10-CM | POA: Diagnosis not present

## 2016-09-15 DIAGNOSIS — Z955 Presence of coronary angioplasty implant and graft: Secondary | ICD-10-CM | POA: Diagnosis not present

## 2016-09-15 DIAGNOSIS — Z8619 Personal history of other infectious and parasitic diseases: Secondary | ICD-10-CM | POA: Diagnosis not present

## 2016-09-15 DIAGNOSIS — Z8249 Family history of ischemic heart disease and other diseases of the circulatory system: Secondary | ICD-10-CM | POA: Diagnosis not present

## 2016-09-15 DIAGNOSIS — G4730 Sleep apnea, unspecified: Secondary | ICD-10-CM | POA: Diagnosis present

## 2016-09-15 DIAGNOSIS — R079 Chest pain, unspecified: Secondary | ICD-10-CM | POA: Diagnosis not present

## 2016-09-25 DIAGNOSIS — Z951 Presence of aortocoronary bypass graft: Secondary | ICD-10-CM | POA: Diagnosis not present

## 2016-09-25 DIAGNOSIS — I517 Cardiomegaly: Secondary | ICD-10-CM | POA: Diagnosis not present

## 2016-09-25 DIAGNOSIS — E119 Type 2 diabetes mellitus without complications: Secondary | ICD-10-CM | POA: Diagnosis not present

## 2016-09-25 DIAGNOSIS — I081 Rheumatic disorders of both mitral and tricuspid valves: Secondary | ICD-10-CM | POA: Diagnosis not present

## 2016-09-25 DIAGNOSIS — I2510 Atherosclerotic heart disease of native coronary artery without angina pectoris: Secondary | ICD-10-CM | POA: Diagnosis not present

## 2016-10-07 DIAGNOSIS — Z951 Presence of aortocoronary bypass graft: Secondary | ICD-10-CM | POA: Diagnosis not present

## 2016-10-07 DIAGNOSIS — I10 Essential (primary) hypertension: Secondary | ICD-10-CM | POA: Diagnosis not present

## 2016-10-07 DIAGNOSIS — I2510 Atherosclerotic heart disease of native coronary artery without angina pectoris: Secondary | ICD-10-CM | POA: Diagnosis not present

## 2017-06-28 DIAGNOSIS — R531 Weakness: Secondary | ICD-10-CM | POA: Diagnosis not present

## 2017-06-28 DIAGNOSIS — J449 Chronic obstructive pulmonary disease, unspecified: Secondary | ICD-10-CM | POA: Diagnosis not present

## 2017-06-28 DIAGNOSIS — Z8042 Family history of malignant neoplasm of prostate: Secondary | ICD-10-CM | POA: Diagnosis not present

## 2017-06-28 DIAGNOSIS — R110 Nausea: Secondary | ICD-10-CM | POA: Diagnosis not present

## 2017-06-28 DIAGNOSIS — Z955 Presence of coronary angioplasty implant and graft: Secondary | ICD-10-CM | POA: Diagnosis not present

## 2017-06-28 DIAGNOSIS — Z8619 Personal history of other infectious and parasitic diseases: Secondary | ICD-10-CM | POA: Diagnosis not present

## 2017-06-28 DIAGNOSIS — R1012 Left upper quadrant pain: Secondary | ICD-10-CM | POA: Diagnosis not present

## 2017-06-28 DIAGNOSIS — E785 Hyperlipidemia, unspecified: Secondary | ICD-10-CM | POA: Diagnosis not present

## 2017-06-28 DIAGNOSIS — K591 Functional diarrhea: Secondary | ICD-10-CM | POA: Diagnosis not present

## 2017-06-28 DIAGNOSIS — Z79899 Other long term (current) drug therapy: Secondary | ICD-10-CM | POA: Diagnosis not present

## 2017-06-28 DIAGNOSIS — Z794 Long term (current) use of insulin: Secondary | ICD-10-CM | POA: Diagnosis not present

## 2017-06-28 DIAGNOSIS — Z7982 Long term (current) use of aspirin: Secondary | ICD-10-CM | POA: Diagnosis not present

## 2017-06-28 DIAGNOSIS — R197 Diarrhea, unspecified: Secondary | ICD-10-CM | POA: Diagnosis not present

## 2017-06-28 DIAGNOSIS — K219 Gastro-esophageal reflux disease without esophagitis: Secondary | ICD-10-CM | POA: Diagnosis not present

## 2017-06-28 DIAGNOSIS — R109 Unspecified abdominal pain: Secondary | ICD-10-CM | POA: Diagnosis not present

## 2017-06-28 DIAGNOSIS — Z7951 Long term (current) use of inhaled steroids: Secondary | ICD-10-CM | POA: Diagnosis not present

## 2017-06-28 DIAGNOSIS — Z8489 Family history of other specified conditions: Secondary | ICD-10-CM | POA: Diagnosis not present

## 2017-06-28 DIAGNOSIS — Z8249 Family history of ischemic heart disease and other diseases of the circulatory system: Secondary | ICD-10-CM | POA: Diagnosis not present

## 2017-06-28 DIAGNOSIS — I10 Essential (primary) hypertension: Secondary | ICD-10-CM | POA: Diagnosis not present

## 2017-06-28 DIAGNOSIS — I2510 Atherosclerotic heart disease of native coronary artery without angina pectoris: Secondary | ICD-10-CM | POA: Diagnosis not present

## 2017-06-28 DIAGNOSIS — K449 Diaphragmatic hernia without obstruction or gangrene: Secondary | ICD-10-CM | POA: Diagnosis not present

## 2017-06-28 DIAGNOSIS — E119 Type 2 diabetes mellitus without complications: Secondary | ICD-10-CM | POA: Diagnosis not present

## 2017-06-28 DIAGNOSIS — M109 Gout, unspecified: Secondary | ICD-10-CM | POA: Diagnosis not present

## 2017-06-28 DIAGNOSIS — R112 Nausea with vomiting, unspecified: Secondary | ICD-10-CM | POA: Diagnosis not present

## 2017-06-28 DIAGNOSIS — Z833 Family history of diabetes mellitus: Secondary | ICD-10-CM | POA: Diagnosis not present

## 2017-06-29 DIAGNOSIS — R1012 Left upper quadrant pain: Secondary | ICD-10-CM | POA: Diagnosis not present

## 2017-06-29 DIAGNOSIS — K591 Functional diarrhea: Secondary | ICD-10-CM | POA: Diagnosis not present

## 2017-06-29 DIAGNOSIS — R531 Weakness: Secondary | ICD-10-CM | POA: Diagnosis not present

## 2017-06-29 DIAGNOSIS — I10 Essential (primary) hypertension: Secondary | ICD-10-CM | POA: Diagnosis not present

## 2017-06-29 DIAGNOSIS — R110 Nausea: Secondary | ICD-10-CM | POA: Diagnosis not present

## 2017-06-29 DIAGNOSIS — N281 Cyst of kidney, acquired: Secondary | ICD-10-CM | POA: Diagnosis not present

## 2017-06-29 DIAGNOSIS — R197 Diarrhea, unspecified: Secondary | ICD-10-CM | POA: Diagnosis not present

## 2017-06-29 DIAGNOSIS — E119 Type 2 diabetes mellitus without complications: Secondary | ICD-10-CM | POA: Diagnosis not present

## 2017-06-29 DIAGNOSIS — R112 Nausea with vomiting, unspecified: Secondary | ICD-10-CM | POA: Diagnosis not present

## 2017-09-30 DIAGNOSIS — Z951 Presence of aortocoronary bypass graft: Secondary | ICD-10-CM | POA: Diagnosis not present

## 2017-09-30 DIAGNOSIS — I10 Essential (primary) hypertension: Secondary | ICD-10-CM | POA: Diagnosis not present

## 2018-09-23 DIAGNOSIS — I2510 Atherosclerotic heart disease of native coronary artery without angina pectoris: Secondary | ICD-10-CM | POA: Diagnosis not present

## 2018-10-31 DIAGNOSIS — N182 Chronic kidney disease, stage 2 (mild): Secondary | ICD-10-CM | POA: Diagnosis not present

## 2018-10-31 DIAGNOSIS — E1149 Type 2 diabetes mellitus with other diabetic neurological complication: Secondary | ICD-10-CM | POA: Diagnosis not present

## 2018-10-31 DIAGNOSIS — Z125 Encounter for screening for malignant neoplasm of prostate: Secondary | ICD-10-CM | POA: Diagnosis not present

## 2018-10-31 DIAGNOSIS — I10 Essential (primary) hypertension: Secondary | ICD-10-CM | POA: Diagnosis not present

## 2018-10-31 DIAGNOSIS — E7849 Other hyperlipidemia: Secondary | ICD-10-CM | POA: Diagnosis not present

## 2018-10-31 DIAGNOSIS — I5022 Chronic systolic (congestive) heart failure: Secondary | ICD-10-CM | POA: Diagnosis not present

## 2018-10-31 DIAGNOSIS — Z6835 Body mass index (BMI) 35.0-35.9, adult: Secondary | ICD-10-CM | POA: Diagnosis not present

## 2018-11-21 DIAGNOSIS — I2510 Atherosclerotic heart disease of native coronary artery without angina pectoris: Secondary | ICD-10-CM | POA: Diagnosis not present

## 2019-03-31 DIAGNOSIS — E875 Hyperkalemia: Secondary | ICD-10-CM | POA: Diagnosis not present

## 2019-03-31 DIAGNOSIS — K219 Gastro-esophageal reflux disease without esophagitis: Secondary | ICD-10-CM | POA: Diagnosis not present

## 2019-03-31 DIAGNOSIS — R079 Chest pain, unspecified: Secondary | ICD-10-CM | POA: Diagnosis not present

## 2019-03-31 DIAGNOSIS — E119 Type 2 diabetes mellitus without complications: Secondary | ICD-10-CM | POA: Diagnosis not present

## 2019-03-31 DIAGNOSIS — I10 Essential (primary) hypertension: Secondary | ICD-10-CM | POA: Diagnosis not present

## 2019-03-31 DIAGNOSIS — E785 Hyperlipidemia, unspecified: Secondary | ICD-10-CM | POA: Diagnosis not present

## 2019-03-31 DIAGNOSIS — J449 Chronic obstructive pulmonary disease, unspecified: Secondary | ICD-10-CM | POA: Diagnosis not present

## 2019-03-31 DIAGNOSIS — I2510 Atherosclerotic heart disease of native coronary artery without angina pectoris: Secondary | ICD-10-CM | POA: Diagnosis not present

## 2019-03-31 DIAGNOSIS — M109 Gout, unspecified: Secondary | ICD-10-CM | POA: Diagnosis not present

## 2019-04-01 DIAGNOSIS — I10 Essential (primary) hypertension: Secondary | ICD-10-CM | POA: Diagnosis not present

## 2019-04-01 DIAGNOSIS — E119 Type 2 diabetes mellitus without complications: Secondary | ICD-10-CM | POA: Diagnosis not present

## 2019-04-01 DIAGNOSIS — M109 Gout, unspecified: Secondary | ICD-10-CM | POA: Diagnosis not present

## 2019-04-01 DIAGNOSIS — E875 Hyperkalemia: Secondary | ICD-10-CM | POA: Diagnosis not present

## 2019-04-01 DIAGNOSIS — K219 Gastro-esophageal reflux disease without esophagitis: Secondary | ICD-10-CM | POA: Diagnosis not present

## 2020-08-06 DIAGNOSIS — R079 Chest pain, unspecified: Secondary | ICD-10-CM | POA: Diagnosis not present

## 2020-08-06 DIAGNOSIS — J9811 Atelectasis: Secondary | ICD-10-CM | POA: Diagnosis not present

## 2020-08-06 DIAGNOSIS — I6523 Occlusion and stenosis of bilateral carotid arteries: Secondary | ICD-10-CM | POA: Diagnosis not present

## 2020-08-06 DIAGNOSIS — I517 Cardiomegaly: Secondary | ICD-10-CM | POA: Diagnosis not present

## 2020-08-06 DIAGNOSIS — R29818 Other symptoms and signs involving the nervous system: Secondary | ICD-10-CM | POA: Diagnosis not present

## 2020-08-07 DIAGNOSIS — R001 Bradycardia, unspecified: Secondary | ICD-10-CM | POA: Diagnosis not present

## 2020-08-07 DIAGNOSIS — I2582 Chronic total occlusion of coronary artery: Secondary | ICD-10-CM | POA: Diagnosis not present

## 2020-08-07 DIAGNOSIS — I6523 Occlusion and stenosis of bilateral carotid arteries: Secondary | ICD-10-CM | POA: Diagnosis not present

## 2020-08-07 DIAGNOSIS — I259 Chronic ischemic heart disease, unspecified: Secondary | ICD-10-CM | POA: Diagnosis not present

## 2020-08-07 DIAGNOSIS — I25118 Atherosclerotic heart disease of native coronary artery with other forms of angina pectoris: Secondary | ICD-10-CM | POA: Diagnosis not present

## 2020-08-07 DIAGNOSIS — R29818 Other symptoms and signs involving the nervous system: Secondary | ICD-10-CM | POA: Diagnosis not present

## 2020-08-07 DIAGNOSIS — E876 Hypokalemia: Secondary | ICD-10-CM | POA: Diagnosis not present

## 2020-08-07 DIAGNOSIS — R079 Chest pain, unspecified: Secondary | ICD-10-CM | POA: Diagnosis not present

## 2020-08-07 DIAGNOSIS — E119 Type 2 diabetes mellitus without complications: Secondary | ICD-10-CM | POA: Diagnosis not present

## 2022-10-05 ENCOUNTER — Ambulatory Visit: Payer: PRIVATE HEALTH INSURANCE | Admitting: Urology

## 2022-10-22 ENCOUNTER — Ambulatory Visit (INDEPENDENT_AMBULATORY_CARE_PROVIDER_SITE_OTHER): Payer: No Typology Code available for payment source | Admitting: Urology

## 2022-10-22 ENCOUNTER — Encounter: Payer: Self-pay | Admitting: Urology

## 2022-10-22 VITALS — BP 102/55 | HR 97 | Ht 71.0 in | Wt 226.0 lb

## 2022-10-22 DIAGNOSIS — N138 Other obstructive and reflux uropathy: Secondary | ICD-10-CM | POA: Diagnosis not present

## 2022-10-22 DIAGNOSIS — N401 Enlarged prostate with lower urinary tract symptoms: Secondary | ICD-10-CM

## 2022-10-22 LAB — BLADDER SCAN AMB NON-IMAGING

## 2022-10-22 MED ORDER — FINASTERIDE 5 MG PO TABS
5.0000 mg | ORAL_TABLET | Freq: Every day | ORAL | 3 refills | Status: DC
Start: 2022-10-22 — End: 2023-08-26

## 2022-10-22 MED ORDER — SILODOSIN 8 MG PO CAPS: 8.0000 mg | ORAL_CAPSULE | Freq: Every day | ORAL | 3 refills | Status: DC

## 2022-10-22 NOTE — Progress Notes (Signed)
Assessment: 1. BPH with obstruction/lower urinary tract symptoms      Plan: Today I had a long discussion with the patient and his wife regarding medical management options for his BPH/lower urinary tract symptoms complicated in the past  Urinary retention.  I recommended combination medical therapy with silodosin and finasteride.  Prescription sent.  Nature of medications including proper utilization as well as potential adverse events and side effects reviewed.  Patient will return in 3 months for recheck with repeat PVR  Chief Complaint: difficulty voiding  History of Present Illness:  Jeff Moran is a 72 y.o. male who lives in [facility] and is accompanied today by his wife who is seen in consultation from [name], MD for evaluation of BPH/lower urinary tract symptoms.  Patient has multiple serious medical comorbidities including coronary artery disease on Plavix and aspirin, hypertension, sleep apnea, PTSD  Patient reports progressive lower urinary tract symptoms over the last 5 to 7 years.  He has not really had any durable medical management.  He also has had several episodes of urinary retention requiring catheterization.  On 09/27/22 he had about 1L drained with Foley placement. He is currently on no BPH meds.  IPSS = 20/4 PVR today = 77 mL DRE-normal sphincter tone; prostate is approximately 40 to 50 g and benign to palpation   CT [facility]p [facility] 10/18/22---Multifocal bilateral simple renal cysts, the largest arising  exophytically from the left lower pole 6 cm. Median lobe prostate  hypertrophy results in focal bladder base impression.   Past Medical History:  Past Medical History:  Diagnosis Date   Anxiety    Asthma    Bronchitis    acute   CAD (coronary artery disease)    Chest pain    unspecifide   Diabetes mellitus    Dyslipidemia    statin intolerance triglycerides 704total cholesterol 249   Dyspnea    GERD (gastroesophageal reflux disease)     post traumatic stress disorder bruxism   Hyperlipidemia    Hypertension    Myocardial infarction (HCC)    non-St elevation myocardial infarction 07/08/2010 triple vessel disease with PTCA andplacement of a drug eluting stent to the right coronary artery   Posttraumatic stress disorder    Rhabdomyolysis    S/P angioplasty with stent    atatus post drug eluting stent to a subtotally occluded first marginal there were 2009 status post drug eluting stent of the circumflex 05/2005 preserved LV function   Sleep apnea    Sleep apnea, obstructive    c-pap    Past Surgical History:  Past Surgical History:  Procedure Laterality Date   bilaterial hand surgery     right and left    Allergies:  No Known Allergies  Family History:  History reviewed. No pertinent family history.  Social History:  Social History   Tobacco Use   Smoking status: Never  Substance Use Topics   Alcohol use: No   Drug use: No    Review of symptoms:  Constitutional:  Negative for unexplained weight loss, night sweats, fever, chills ENT:  Negative for nose bleeds, sinus pain, painful swallowing CV:  Negative for chest pain, shortness of breath, exercise intolerance, palpitations, loss of consciousness Resp:  Negative for cough, wheezing, shortness of breath GI:  Negative for nausea, vomiting, diarrhea, bloody stools GU:  Positives noted in HPI; otherwise negative for gross hematuria, dysuria, urinary incontinence Neuro:  Negative for seizures, poor balance, limb weakness, slurred speech Psych:  Negative  for lack of energy, depression, anxiety Endocrine:  Negative for polydipsia, polyuria, symptoms of hypoglycemia (dizziness, hunger, sweating) Hematologic:  Negative for anemia, purpura, petechia, prolonged or excessive bleeding, use of anticoagulants  Allergic:  Negative for difficulty breathing or choking as a result of exposure to anything; no shellfish allergy; no allergic response (rash/itch) to materials,  foods  Physical exam: BP (!) 102/55   Pulse 97   Ht 5\' 11"  (1.803 m)   Wt 226 lb (102.5 kg)   BMI 31.52 kg/m  GENERAL APPEARANCE:  Well appearing, well developed, well nourished, NAD  GU: Uncircumcised phallus with mild phimosis and skin irritation.  Patient and wife are attending to that with local hygiene. DRE: Normal sphincter tone; prostate is 40 to 50 g benign to palpation  Results: Results for orders placed or performed in visit on 10/22/22 (from the past 24 hour(s))  BLADDER SCAN AMB NON-IMAGING   Collection Time: 10/22/22 12:00 AM  Result Value Ref Range   Scan Result 78ml

## 2023-02-03 ENCOUNTER — Ambulatory Visit: Payer: Medicare Other | Admitting: Urology

## 2023-02-04 ENCOUNTER — Ambulatory Visit (INDEPENDENT_AMBULATORY_CARE_PROVIDER_SITE_OTHER): Payer: No Typology Code available for payment source | Admitting: Urology

## 2023-02-04 ENCOUNTER — Encounter: Payer: Self-pay | Admitting: Urology

## 2023-02-04 VITALS — BP 129/61 | HR 71 | Ht 72.0 in | Wt 214.0 lb

## 2023-02-04 DIAGNOSIS — N138 Other obstructive and reflux uropathy: Secondary | ICD-10-CM | POA: Diagnosis not present

## 2023-02-04 DIAGNOSIS — N401 Enlarged prostate with lower urinary tract symptoms: Secondary | ICD-10-CM | POA: Diagnosis not present

## 2023-02-04 NOTE — Progress Notes (Signed)
   Assessment: 1. BPH with obstruction/lower urinary tract symptoms     Plan: Continue combination therapy with silodosin and finasteride. Follow-up 6 months for recheck with IPSS and PVR  Chief Complaint: LUTS  HPI: Jeff Moran is a 72 y.o. male who presents for continued evaluation of BPH/lower urinary tract symptoms complicated in the past by urinary retention. PCP = [name], MD    Patient has multiple serious medical comorbidities including coronary artery disease on Plavix and aspirin, hypertension, sleep apnea, PTSD  Please see my note 10/22/2022 at the time of initial visit for detailed history and exam which is summarized below. At that time, patient was started on combination medical therapy with silodosin and finasteride.  Patient has reported marked improvement in his lower urinary tract symptoms. IPSS =10 PVR =  patient could not void in office today   History summary-- Patient reports progressive lower urinary tract symptoms over the last 5 to 7 years. He also has had several episodes of urinary retention requiring catheterization.  On 09/27/22 he had about 1L drained with Foley placement.  Baseline status-- IPSS = 20/4 PVR  = 77 mL DRE-normal sphincter tone; prostate is approximately 40 to 50 g and benign to palpation     CT [facility]/22---Multifocal bilateral simple renal cysts, the largest arising  exophytically from the left lower pole 6 cm. Median lobe prostate  hypertrophy results in focal bladder base impression.     Portions of the above documentation were copied from a prior visit for review purposes only.  Allergies: No Known Allergies  PMH: Past Medical History:  Diagnosis Date   Anxiety    Asthma    Bronchitis    acute   CAD (coronary artery disease)    Chest pain    unspecifide   Diabetes mellitus    Dyslipidemia    statin intolerance triglycerides 704total cholesterol 249   Dyspnea    GERD (gastroesophageal reflux disease)     post traumatic stress disorder bruxism   Hyperlipidemia    Hypertension    Myocardial infarction (HCC)    non-St elevation myocardial infarction 07/08/2010 triple vessel disease with PTCA andplacement of a drug eluting stent to the right coronary artery   Posttraumatic stress disorder    Rhabdomyolysis    S/P angioplasty with stent    atatus post drug eluting stent to a subtotally occluded first marginal there were 2009 status post drug eluting stent of the circumflex 05/2005 preserved LV function   Sleep apnea    Sleep apnea, obstructive    c-pap    PSH: Past Surgical History:  Procedure Laterality Date   bilaterial hand surgery     right and left    SH: Social History   Tobacco Use   Smoking status: Never  Substance Use Topics   Alcohol use: No   Drug use: No    ROS: Constitutional:  Negative for fever, chills, weight loss CV: Negative for chest pain, previous MI, hypertension Respiratory:  Negative for shortness of breath, wheezing, sleep apnea, frequent cough GI:  Negative for nausea, vomiting, bloody stool, GERD  PE: Vitals:   02/04/23 1430  BP: 129/61  Pulse: 71    GENERAL APPEARANCE:  Well appearing, well developed, well nourished, NAD

## 2023-02-12 ENCOUNTER — Emergency Department (HOSPITAL_COMMUNITY)
Admission: EM | Admit: 2023-02-12 | Discharge: 2023-02-13 | Disposition: A | Discharge status: Home / Self Care | Payer: No Typology Code available for payment source | Attending: Emergency Medicine | Admitting: Emergency Medicine

## 2023-02-12 ENCOUNTER — Emergency Department (HOSPITAL_COMMUNITY): Payer: No Typology Code available for payment source

## 2023-02-12 ENCOUNTER — Other Ambulatory Visit: Payer: Self-pay

## 2023-02-12 ENCOUNTER — Encounter (HOSPITAL_COMMUNITY): Payer: Self-pay | Admitting: Emergency Medicine

## 2023-02-12 DIAGNOSIS — Z79899 Other long term (current) drug therapy: Secondary | ICD-10-CM | POA: Diagnosis not present

## 2023-02-12 DIAGNOSIS — I10 Essential (primary) hypertension: Secondary | ICD-10-CM | POA: Insufficient documentation

## 2023-02-12 DIAGNOSIS — E876 Hypokalemia: Secondary | ICD-10-CM | POA: Diagnosis not present

## 2023-02-12 DIAGNOSIS — Z794 Long term (current) use of insulin: Secondary | ICD-10-CM | POA: Insufficient documentation

## 2023-02-12 DIAGNOSIS — I2510 Atherosclerotic heart disease of native coronary artery without angina pectoris: Secondary | ICD-10-CM | POA: Diagnosis not present

## 2023-02-12 DIAGNOSIS — J189 Pneumonia, unspecified organism: Secondary | ICD-10-CM | POA: Insufficient documentation

## 2023-02-12 DIAGNOSIS — R0602 Shortness of breath: Secondary | ICD-10-CM | POA: Diagnosis present

## 2023-02-12 DIAGNOSIS — Z7902 Long term (current) use of antithrombotics/antiplatelets: Secondary | ICD-10-CM | POA: Diagnosis not present

## 2023-02-12 DIAGNOSIS — Z7982 Long term (current) use of aspirin: Secondary | ICD-10-CM | POA: Diagnosis not present

## 2023-02-12 DIAGNOSIS — Z7984 Long term (current) use of oral hypoglycemic drugs: Secondary | ICD-10-CM | POA: Insufficient documentation

## 2023-02-12 DIAGNOSIS — J45909 Unspecified asthma, uncomplicated: Secondary | ICD-10-CM | POA: Diagnosis not present

## 2023-02-12 DIAGNOSIS — E119 Type 2 diabetes mellitus without complications: Secondary | ICD-10-CM | POA: Insufficient documentation

## 2023-02-12 LAB — CBC WITH DIFFERENTIAL/PLATELET
Abs Immature Granulocytes: 0.03 10*3/uL (ref 0.00–0.07)
Basophils Absolute: 0.1 10*3/uL (ref 0.0–0.1)
Basophils Relative: 1 %
Eosinophils Absolute: 0.2 10*3/uL (ref 0.0–0.5)
Eosinophils Relative: 2 %
HCT: 35.4 % — ABNORMAL LOW (ref 39.0–52.0)
Hemoglobin: 11.3 g/dL — ABNORMAL LOW (ref 13.0–17.0)
Immature Granulocytes: 0 %
Lymphocytes Relative: 20 %
Lymphs Abs: 2.2 10*3/uL (ref 0.7–4.0)
MCH: 25.6 pg — ABNORMAL LOW (ref 26.0–34.0)
MCHC: 31.9 g/dL (ref 30.0–36.0)
MCV: 80.1 fL (ref 80.0–100.0)
Monocytes Absolute: 0.9 10*3/uL (ref 0.1–1.0)
Monocytes Relative: 8 %
Neutro Abs: 7.4 10*3/uL (ref 1.7–7.7)
Neutrophils Relative %: 69 %
Platelets: 328 10*3/uL (ref 150–400)
RBC: 4.42 MIL/uL (ref 4.22–5.81)
RDW: 16 % — ABNORMAL HIGH (ref 11.5–15.5)
WBC: 10.8 10*3/uL — ABNORMAL HIGH (ref 4.0–10.5)
nRBC: 0 % (ref 0.0–0.2)

## 2023-02-12 LAB — BASIC METABOLIC PANEL
Anion gap: 9 (ref 5–15)
BUN: 11 mg/dL (ref 8–23)
CO2: 27 mmol/L (ref 22–32)
Calcium: 8.5 mg/dL — ABNORMAL LOW (ref 8.9–10.3)
Chloride: 100 mmol/L (ref 98–111)
Creatinine, Ser: 0.79 mg/dL (ref 0.61–1.24)
GFR, Estimated: >60 mL/min (ref >60)
Glucose, Bld: 99 mg/dL (ref 70–99)
Potassium: 2.9 mmol/L — ABNORMAL LOW (ref 3.5–5.1)
Sodium: 136 mmol/L (ref 135–145)

## 2023-02-12 MED ORDER — POTASSIUM CHLORIDE 10 MEQ/100ML IV SOLN
10.0000 meq | INTRAVENOUS | Status: AC
  Administered 2023-02-13 (×2): 10 meq via INTRAVENOUS
  Filled 2023-02-12 (×2): qty 100

## 2023-02-12 MED ORDER — HYDROMORPHONE HCL 1 MG/ML IJ SOLN
0.5000 mg | Freq: Once | INTRAMUSCULAR | Status: AC
  Administered 2023-02-13: 0.5 mg via INTRAVENOUS
  Filled 2023-02-12: qty 0.5

## 2023-02-12 MED ORDER — ALBUTEROL SULFATE (2.5 MG/3ML) 0.083% IN NEBU
5.0000 mg | INHALATION_SOLUTION | Freq: Once | RESPIRATORY_TRACT | Status: AC
  Administered 2023-02-12: 5 mg via RESPIRATORY_TRACT
  Filled 2023-02-12: qty 6

## 2023-02-12 MED ORDER — IPRATROPIUM BROMIDE 0.02 % IN SOLN
0.5000 mg | Freq: Once | RESPIRATORY_TRACT | Status: AC
  Administered 2023-02-12: 0.5 mg via RESPIRATORY_TRACT
  Filled 2023-02-12: qty 2.5

## 2023-02-12 MED ORDER — POTASSIUM CHLORIDE CRYS ER 20 MEQ PO TBCR
40.0000 meq | EXTENDED_RELEASE_TABLET | Freq: Once | ORAL | Status: AC
  Administered 2023-02-13: 40 meq via ORAL
  Filled 2023-02-12: qty 2

## 2023-02-12 MED ORDER — ALBUTEROL SULFATE HFA 108 (90 BASE) MCG/ACT IN AERS: 2.0000 | INHALATION_SPRAY | RESPIRATORY_TRACT | Status: DC | PRN

## 2023-02-12 NOTE — ED Provider Notes (Signed)
[place] EMERGENCY DEPARTMENT AT [facility] Provider Note   CSN: [id number] Arrival date & time: 02/12/23  2059     History  Chief Complaint  Patient presents with   Shortness of Breath    Jeff Moran is a 72 y.o. male.   Shortness of Breath Patient with acute on chronic shortness of breath.  Around a month ago had COVID and states he really has not been doing great since then.  States worsened however over the last week with increased cough and increased sputum production.  Has felt weak and sweaty at times. Has lung disease and has oxygen at home to use as needed.  Sats went down to the upper 80s.    Past Medical History:  Diagnosis Date   Anxiety    Asthma    Bronchitis    acute   CAD (coronary artery disease)    Chest pain    unspecifide   Diabetes mellitus    Dyslipidemia    statin intolerance triglycerides 704total cholesterol 249   Dyspnea    GERD (gastroesophageal reflux disease)    post traumatic stress disorder bruxism   Hyperlipidemia    Hypertension    Myocardial infarction (HCC)    non-St elevation myocardial infarction 07/08/2010 triple vessel disease with PTCA andplacement of a drug eluting stent to the right coronary artery   Posttraumatic stress disorder    Rhabdomyolysis    S/P angioplasty with stent    atatus post drug eluting stent to a subtotally occluded first marginal there were 2009 status post drug eluting stent of the circumflex 05/2005 preserved LV function   Sleep apnea    Sleep apnea, obstructive    c-pap    Home Medications Prior to Admission medications   Medication Sig Start Date End Date Taking? Authorizing Provider  acetaminophen (TYLENOL) 325 MG tablet Take 650 mg by mouth every 6 (six) hours as needed.      [provider]  albuterol-ipratropium (COMBIVENT) 18-103 MCG/ACT inhaler Inhale 2 puffs into the lungs every 6 (six) hours as needed.      [provider]  allopurinol (ZYLOPRIM) 100 MG  tablet Take 100 mg by mouth 2 (two) times daily.      [provider]  amLODipine (NORVASC) 10 MG tablet Take 10 mg by mouth daily.      [provider]  aspirin 325 MG tablet Take 325 mg by mouth daily.      [provider]  atenolol (TENORMIN) 100 MG tablet Take 100 mg by mouth daily.      [provider]  chlorthalidone (HYGROTON) 25 MG tablet Take 25 mg by mouth daily.      [provider]  citalopram (CELEXA) 40 MG tablet Take 40 mg by mouth daily.      [provider]  cloNIDine (CATAPRES) 0.3 MG tablet Take 0.3 mg by mouth 2 (two) times daily.    [provider]  clopidogrel (PLAVIX) 75 MG tablet Take 75 mg by mouth daily.      [provider]  dextromethorphan-guaiFENesin (MUCINEX DM) 30-600 MG per 12 hr tablet Take 1 tablet by mouth every 12 (twelve) hours.      [provider]  finasteride (PROSCAR) 5 MG tablet Take 1 tablet (5 mg total) by mouth daily. 10/22/22   Joline Maxcy, MD  fish oil-omega-3 fatty acids 1000 MG capsule Take 2 g by mouth 2 (two) times daily.      [provider]  Fluticasone-Salmeterol (ADVAIR DISKUS) 250-50 MCG/DOSE AEPB Inhale 1 puff into the lungs every 12 (twelve) hours.      [provider]  glipiZIDE (GLUCOTROL) 10 MG tablet Take 10 mg by mouth 2 (two) times daily before a meal.      [provider]  insulin glargine (LANTUS) 100 UNIT/ML injection Inject into the skin as directed.      [provider]  insulin regular (HUMULIN R) 100 UNIT/ML injection Inject into the skin as directed.      [provider]  montelukast (SINGULAIR) 10 MG tablet Take 10 mg by mouth at bedtime.      [provider]  Naproxen Sodium (ALEVE) 220 MG CAPS Take 2 capsules by mouth 2 (two) times daily.      [provider]  nitroGLYCERIN (NITROSTAT) 0.4 MG SL tablet Place 0.4 mg under the tongue every 5 (five) minutes as needed.      [provider]  omeprazole (PRILOSEC) 40 MG capsule Take 40 mg by mouth 2 (two) times daily.      [provider]  pentazocine-naloxone (TALWIN NX) 50-0.5 MG per tablet Take 1 tablet by mouth every 4 (four) hours as needed.    [provider]  QUEtiapine (SEROQUEL) 400 MG tablet Take 400 mg by mouth at bedtime.    [provider]  silodosin (RAPAFLO) 8 MG CAPS capsule Take 1 capsule (8 mg total) by mouth daily with breakfast. 10/22/22   Joline Maxcy, MD  simvastatin (ZOCOR) 80 MG tablet Take 80 mg by mouth at bedtime.      [provider]  sodium chloride (MURO 128) 2 % ophthalmic solution 1 drop 2 (two) times daily.    [provider]  traMADol (ULTRAM) 50 MG tablet Take 50 mg by mouth every 6 (six) hours as needed.    [provider]      Allergies    Patient has no known allergies.    Review of Systems   Review of Systems  Respiratory:  Positive for shortness of breath.     Physical Exam Updated Vital Signs BP (!) 145/73 (BP Location: Right Arm)   Pulse 94   Temp 99.3 F (37.4 C) (Oral)   Resp 18   Ht 6' (1.829 m)   Wt 97.1 kg   SpO2 95%   BMI 29.02 kg/m  Physical Exam Vitals and nursing note reviewed.  HENT:     Head: Atraumatic.  Cardiovascular:     Rate and Rhythm: Regular rhythm.  Pulmonary:     Comments: Somewhat harsh breath sounds diffusely. Chest:     Chest wall: No tenderness.  Musculoskeletal:     Comments: Mild edema bilateral lower extremities.  Skin:    General: Skin is warm.  Neurological:     Mental Status: He is alert.     ED Results / Procedures / Treatments   Labs (all labs ordered are listed, but only abnormal results are displayed) Labs Reviewed  BASIC METABOLIC PANEL - Abnormal; Notable for the following components:      Result Value   Potassium 2.9 (*)    Calcium 8.5 (*)    All other components within normal limits  CBC WITH DIFFERENTIAL/PLATELET - Abnormal; Notable for the  following components:   WBC 10.8 (*)    Hemoglobin 11.3 (*)    HCT 35.4 (*)    MCH 25.6 (*)    RDW 16.0 (*)    All  other components within normal limits  MAGNESIUM    EKG None  Radiology DG Chest 2 View  Result Date: 02/12/2023 CLINICAL DATA:  Shortness of breath. EXAM: CHEST - 2 VIEW COMPARISON:  09/04/2022 FINDINGS: Ill-defined retrocardiac opacity, new from prior exam. Prior median sternotomy with stable heart size and mediastinal contours. No pulmonary edema or pleural effusion. No pneumothorax. Thoracic spondylosis. IMPRESSION: Ill-defined retrocardiac opacity, suspicious for pneumonia in the appropriate clinical setting. Electronically Signed   By: Narda Rutherford M.D.   On: 02/12/2023 21:35    Procedures Procedures    Medications Ordered in ED Medications  albuterol (VENTOLIN HFA) 108 (90 Base) MCG/ACT inhaler 2 puff (has no administration in time range)  HYDROmorphone (DILAUDID) injection 0.5 mg (has no administration in time range)  potassium chloride 10 mEq in 100 mL IVPB (has no administration in time range)  potassium chloride SA (KLOR-CON M) CR tablet 40 mEq (has no administration in time range)  albuterol (PROVENTIL) (2.5 MG/3ML) 0.083% nebulizer solution 5 mg (5 mg Nebulization Given 02/12/23 2259)  ipratropium (ATROVENT) nebulizer solution 0.5 mg (0.5 mg Nebulization Given 02/12/23 2300)    ED Course/ Medical Decision Making/ A&P                                 Medical Decision Making Amount and/or Complexity of Data Reviewed Labs: ordered. Radiology: ordered.  Risk Prescription drug management.   Patient shortness of breath.  Cough.  History of lung disease.  Does have some hypoxia here but does have oxygen at home.  X-ray shows potential left lower lobe pneumonia.  However reviewed some outpatient imaging and had chronic collapse of left lower lobe.  Patient states that is from agent orange.  Will get basic blood work.  However with cough increase sputum  production and worsening hypoxia I think would treat as pneumonia in that area.  If blood work reassuring should be able to discharge home with antibiotics.  Does have hypokalemia.  With fatigue and feeling bad I think he would benefit from some supplementation here.  Will give 2 rounds potassium and some oral treatment.  Also will check magnesium.  Has chronic pain in his left leg that is worsened.  Will give some pain medicine to help.  Is on Suboxone at baseline.  Care turned over to Dr. Bebe Shaggy         Final Clinical Impression(s) / ED Diagnoses Final diagnoses:  Community acquired pneumonia of left lung, unspecified part of lung  Hypokalemia    Rx / DC Orders ED Discharge Orders     None         Benjiman Core, MD 02/12/23 2357

## 2023-02-12 NOTE — ED Triage Notes (Signed)
Pt with c/o SOB and productive cough (green phlegm). Pt in hospital a month ago with Covid. States has been feeling this was since he was discharged. Wears O2 as needed at home. Also reports using Albuterol Inhaler at home without relief.

## 2023-02-12 NOTE — ED Notes (Signed)
Lab to add on magnesium  

## 2023-02-13 LAB — MAGNESIUM: Magnesium: 1.9 mg/dL (ref 1.7–2.4)

## 2023-02-13 MED ORDER — AZITHROMYCIN 250 MG PO TABS
500.0000 mg | ORAL_TABLET | Freq: Once | ORAL | Status: AC
  Administered 2023-02-13: 500 mg via ORAL
  Filled 2023-02-13: qty 2

## 2023-02-13 MED ORDER — AZITHROMYCIN 250 MG PO TABS: 250.0000 mg | ORAL_TABLET | Freq: Every day | ORAL | 0 refills | Status: DC

## 2023-02-13 MED ORDER — HYDROCODONE-ACETAMINOPHEN 5-325 MG PO TABS
1.0000 | ORAL_TABLET | Freq: Once | ORAL | Status: AC
  Administered 2023-02-13: 1 via ORAL
  Filled 2023-02-13: qty 1

## 2023-02-13 MED ORDER — AMOXICILLIN-POT CLAVULANATE 875-125 MG PO TABS
1.0000 | ORAL_TABLET | Freq: Once | ORAL | Status: AC
  Administered 2023-02-13: 1 via ORAL
  Filled 2023-02-13: qty 1

## 2023-02-13 MED ORDER — AMOXICILLIN-POT CLAVULANATE 875-125 MG PO TABS: 1.0000 | ORAL_TABLET | Freq: Two times a day (BID) | ORAL | 0 refills | Status: DC

## 2023-02-13 NOTE — ED Provider Notes (Signed)
Plan at signout to receive IV potassium Check magnesium D/c home with Augmentin/Zpack if stable to go home   Zadie Rhine, MD 02/13/23 0004

## 2023-02-13 NOTE — ED Provider Notes (Signed)
Pt improved No distress He is requesting d/c home I did offer admission due to his underlying health conditions but pt declines BP (!) 149/72   Pulse 81   Temp 99.3 F (37.4 C) (Oral)   Resp 13   Ht 1.829 m (6')   Wt 97.1 kg   SpO2 98%   BMI 29.02 kg/m  Patient given potassium here.  Initial antibiotics were also given. Advised to use his albuterol at home. Also advised to use his home oxygen    Zadie Rhine, MD 02/13/23 [phone]

## 2023-02-17 ENCOUNTER — Ambulatory Visit: Payer: Medicare Other | Admitting: Urology

## 2023-03-17 ENCOUNTER — Encounter (HOSPITAL_COMMUNITY): Payer: Self-pay | Admitting: Emergency Medicine

## 2023-03-17 ENCOUNTER — Emergency Department (HOSPITAL_COMMUNITY)
Admission: EM | Admit: 2023-03-17 | Discharge: 2023-03-17 | Disposition: A | Discharge status: Home / Self Care | Payer: No Typology Code available for payment source | Attending: Emergency Medicine | Admitting: Emergency Medicine

## 2023-03-17 ENCOUNTER — Other Ambulatory Visit: Payer: Self-pay

## 2023-03-17 ENCOUNTER — Emergency Department (HOSPITAL_COMMUNITY): Payer: No Typology Code available for payment source

## 2023-03-17 DIAGNOSIS — Z79899 Other long term (current) drug therapy: Secondary | ICD-10-CM | POA: Insufficient documentation

## 2023-03-17 DIAGNOSIS — R509 Fever, unspecified: Secondary | ICD-10-CM | POA: Diagnosis present

## 2023-03-17 DIAGNOSIS — J189 Pneumonia, unspecified organism: Secondary | ICD-10-CM | POA: Diagnosis not present

## 2023-03-17 DIAGNOSIS — J449 Chronic obstructive pulmonary disease, unspecified: Secondary | ICD-10-CM | POA: Insufficient documentation

## 2023-03-17 DIAGNOSIS — Z7984 Long term (current) use of oral hypoglycemic drugs: Secondary | ICD-10-CM | POA: Insufficient documentation

## 2023-03-17 DIAGNOSIS — Z7902 Long term (current) use of antithrombotics/antiplatelets: Secondary | ICD-10-CM | POA: Diagnosis not present

## 2023-03-17 DIAGNOSIS — Z20822 Contact with and (suspected) exposure to covid-19: Secondary | ICD-10-CM | POA: Insufficient documentation

## 2023-03-17 LAB — BASIC METABOLIC PANEL
Anion gap: 12 (ref 5–15)
BUN: 8 mg/dL (ref 8–23)
CO2: 26 mmol/L (ref 22–32)
Calcium: 9.2 mg/dL (ref 8.9–10.3)
Chloride: 101 mmol/L (ref 98–111)
Creatinine, Ser: 0.83 mg/dL (ref 0.61–1.24)
GFR, Estimated: >60 mL/min (ref >60)
Glucose, Bld: 131 mg/dL — ABNORMAL HIGH (ref 70–99)
Potassium: 4.4 mmol/L (ref 3.5–5.1)
Sodium: 139 mmol/L (ref 135–145)

## 2023-03-17 LAB — CBC
HCT: 39.6 % (ref 39.0–52.0)
Hemoglobin: 12.5 g/dL — ABNORMAL LOW (ref 13.0–17.0)
MCH: 26.2 pg (ref 26.0–34.0)
MCHC: 31.6 g/dL (ref 30.0–36.0)
MCV: 82.8 fL (ref 80.0–100.0)
Platelets: 260 10*3/uL (ref 150–400)
RBC: 4.78 MIL/uL (ref 4.22–5.81)
RDW: 15.9 % — ABNORMAL HIGH (ref 11.5–15.5)
WBC: 7.3 10*3/uL (ref 4.0–10.5)
nRBC: 0 % (ref 0.0–0.2)

## 2023-03-17 LAB — CBG MONITORING, ED: Glucose-Capillary: 102 mg/dL — ABNORMAL HIGH (ref 70–99)

## 2023-03-17 LAB — TROPONIN I (HIGH SENSITIVITY)
Troponin I (High Sensitivity): 7 ng/L (ref <18)
Troponin I (High Sensitivity): 7 ng/L (ref <18)

## 2023-03-17 LAB — SARS CORONAVIRUS 2 BY RT PCR: SARS Coronavirus 2 by RT PCR: NEGATIVE

## 2023-03-17 MED ORDER — AZITHROMYCIN 250 MG PO TABS
250.0000 mg | ORAL_TABLET | Freq: Every day | ORAL | 0 refills | Status: AC
Start: 2023-03-17 — End: ?

## 2023-03-17 MED ORDER — AZITHROMYCIN 250 MG PO TABS
500.0000 mg | ORAL_TABLET | Freq: Once | ORAL | Status: AC
  Administered 2023-03-17: 500 mg via ORAL
  Filled 2023-03-17: qty 2

## 2023-03-17 MED ORDER — AMOXICILLIN-POT CLAVULANATE 875-125 MG PO TABS
1.0000 | ORAL_TABLET | Freq: Once | ORAL | Status: AC
  Administered 2023-03-17: 1 via ORAL
  Filled 2023-03-17: qty 1

## 2023-03-17 MED ORDER — MORPHINE SULFATE (PF) 4 MG/ML IV SOLN: 4.0000 mg | Freq: Once | INTRAVENOUS | Status: DC

## 2023-03-17 MED ORDER — AMOXICILLIN-POT CLAVULANATE 875-125 MG PO TABS
1.0000 | ORAL_TABLET | Freq: Two times a day (BID) | ORAL | 0 refills | Status: AC
Start: 2023-03-17 — End: ?

## 2023-03-17 MED ORDER — ONDANSETRON 4 MG PO TBDP
4.0000 mg | ORAL_TABLET | Freq: Once | ORAL | Status: AC
  Administered 2023-03-17: 4 mg via ORAL
  Filled 2023-03-17: qty 1

## 2023-03-17 MED ORDER — MORPHINE SULFATE (PF) 4 MG/ML IV SOLN
4.0000 mg | Freq: Once | INTRAVENOUS | Status: AC
  Administered 2023-03-17: 4 mg via INTRAVENOUS
  Filled 2023-03-17: qty 1

## 2023-03-17 NOTE — ED Provider Triage Note (Signed)
Emergency Medicine Provider Triage Evaluation Note  Jeff Moran , a 72 y.o. male  was evaluated in triage.  Pt complains of chills, cough, chest tightness since last night. Family reports last time he had these symptoms he had COVID. Pt uses oxygen at home as needed, has been wearing it since last night but did not bring with him to hospital. Coughing up thick yellow/green mucus. Hx of asthma  Review of Systems  Positive: As above Negative:   Physical Exam  BP (!) 170/78   Pulse 92   Temp 98.6 F (37 C) (Oral)   Resp 18   Ht 6' (1.829 m)   Wt 96.2 kg   SpO2 95%   BMI 28.75 kg/m  Gen:   Awake, no distress   Resp:  Normal effort  MSK:   Moves extremities without difficulty  Other:  No appreciable leg swelling, adventitious lung sounds to R upper chest  Medical Decision Making  Medically screening exam initiated at 4:15 PM.  Appropriate orders placed.  Jeff Moran was informed that the remainder of the evaluation will be completed by another provider, this initial triage assessment does not replace that evaluation, and the importance of remaining in the ED until their evaluation is complete.  Workup initiated including CBC, BMP, troponin, COVID, CXR, EKG   Llana Deshazo T, PA-C 03/17/23 1615

## 2023-03-17 NOTE — ED Triage Notes (Signed)
Pt complains of productive cough and chills started last night

## 2023-03-17 NOTE — ED Triage Notes (Signed)
Pt states chest feels tight.

## 2023-03-17 NOTE — ED Provider Notes (Signed)
[place] EMERGENCY DEPARTMENT AT [facility] Provider Note   CSN: [id number] Arrival date & time: 03/17/23  1507     History {Add pertinent medical, surgical, social history, OB history to HPI:1} Chief Complaint  Patient presents with   Cough   Fever   Chills   Chest Pain    ESKEL BROTHERSON is a 72 y.o. male.  He has history of COPD on 3 L of oxygen as needed.  Presents the ER complaining of cough and subjective fever that started last night, cough is productive of yellow sputum, feels exactly like symptoms when diagnosed with community-acquired pneumonia just over a month ago.  He took azithromycin and went and everything got better.  Same symptoms started last night.  He did have abnormal chest x-ray last time he was in the ED had subsequent outpatient CT of the chest which was normal.   Cough Associated symptoms: chest pain and fever   Fever Associated symptoms: chest pain and cough   Chest Pain Associated symptoms: cough and fever        Home Medications Prior to Admission medications   Medication Sig Start Date End Date Taking? Authorizing Provider  acetaminophen (TYLENOL) 325 MG tablet Take 650 mg by mouth every 6 (six) hours as needed.      [provider]  albuterol-ipratropium (COMBIVENT) 18-103 MCG/ACT inhaler Inhale 2 puffs into the lungs every 6 (six) hours as needed.      [provider]  allopurinol (ZYLOPRIM) 100 MG tablet Take 100 mg by mouth 2 (two) times daily.      [provider]  amLODipine (NORVASC) 10 MG tablet Take 10 mg by mouth daily.      [provider]  amoxicillin-clavulanate (AUGMENTIN) 875-125 MG tablet Take 1 tablet by mouth every 12 (twelve) hours. 02/13/23   Zadie Rhine, MD  aspirin 325 MG tablet Take 325 mg by mouth daily.      [provider]  atenolol (TENORMIN) 100 MG tablet Take 100 mg by mouth daily.      [provider]  azithromycin (ZITHROMAX) 250 MG tablet Take 1  tablet (250 mg total) by mouth daily. 02/13/23   Zadie Rhine, MD  chlorthalidone (HYGROTON) 25 MG tablet Take 25 mg by mouth daily.      [provider]  citalopram (CELEXA) 40 MG tablet Take 40 mg by mouth daily.      [provider]  cloNIDine (CATAPRES) 0.3 MG tablet Take 0.3 mg by mouth 2 (two) times daily.    [provider]  clopidogrel (PLAVIX) 75 MG tablet Take 75 mg by mouth daily.      [provider]  dextromethorphan-guaiFENesin (MUCINEX DM) 30-600 MG per 12 hr tablet Take 1 tablet by mouth every 12 (twelve) hours.      [provider]  finasteride (PROSCAR) 5 MG tablet Take 1 tablet (5 mg total) by mouth daily. 10/22/22   Joline Maxcy, MD  fish oil-omega-3 fatty acids 1000 MG capsule Take 2 g by mouth 2 (two) times daily.      [provider]  Fluticasone-Salmeterol (ADVAIR DISKUS) 250-50 MCG/DOSE AEPB Inhale 1 puff into the lungs every 12 (twelve) hours.      [provider]  glipiZIDE (GLUCOTROL) 10 MG tablet Take 10 mg by mouth 2 (two) times daily before a meal.      [provider]  insulin glargine (LANTUS) 100 UNIT/ML injection Inject into the skin as directed.  [provider]  insulin regular (HUMULIN R) 100 UNIT/ML injection Inject into the skin as directed.      [provider]  montelukast (SINGULAIR) 10 MG tablet Take 10 mg by mouth at bedtime.      [provider]  Naproxen Sodium (ALEVE) 220 MG CAPS Take 2 capsules by mouth 2 (two) times daily.      [provider]  nitroGLYCERIN (NITROSTAT) 0.4 MG SL tablet Place 0.4 mg under the tongue every 5 (five) minutes as needed.      [provider]  omeprazole (PRILOSEC) 40 MG capsule Take 40 mg by mouth 2 (two) times daily.      [provider]  pentazocine-naloxone (TALWIN NX) 50-0.5 MG per tablet Take 1 tablet by mouth every 4 (four) hours as needed.    [provider]  QUEtiapine  (SEROQUEL) 400 MG tablet Take 400 mg by mouth at bedtime.    [provider]  silodosin (RAPAFLO) 8 MG CAPS capsule Take 1 capsule (8 mg total) by mouth daily with breakfast. 10/22/22   Joline Maxcy, MD  simvastatin (ZOCOR) 80 MG tablet Take 80 mg by mouth at bedtime.      [provider]  sodium chloride (MURO 128) 2 % ophthalmic solution 1 drop 2 (two) times daily.    [provider]  traMADol (ULTRAM) 50 MG tablet Take 50 mg by mouth every 6 (six) hours as needed.    [provider]      Allergies    Patient has no known allergies.    Review of Systems   Review of Systems  Constitutional:  Positive for fever.  Respiratory:  Positive for cough.   Cardiovascular:  Positive for chest pain.    Physical Exam Updated Vital Signs BP (!) 171/85 (BP Location: Right Arm)   Pulse 94   Temp 98.3 F (36.8 C) (Oral)   Resp 17   Ht 6' (1.829 m)   Wt 96.2 kg   SpO2 95%   BMI 28.75 kg/m  Physical Exam Vitals and nursing note reviewed.  Constitutional:      General: He is not in acute distress.    Appearance: He is well-developed.  HENT:     Head: Normocephalic and atraumatic.  Eyes:     Conjunctiva/sclera: Conjunctivae normal.  Cardiovascular:     Rate and Rhythm: Normal rate and regular rhythm.     Heart sounds: No murmur heard. Pulmonary:     Effort: Pulmonary effort is normal. No respiratory distress.     Breath sounds: Normal breath sounds.  Abdominal:     Palpations: Abdomen is soft.     Tenderness: There is no abdominal tenderness.  Musculoskeletal:        General: No swelling. Normal range of motion.     Cervical back: Neck supple.  Skin:    General: Skin is warm and dry.     Capillary Refill: Capillary refill takes less than 2 seconds.  Neurological:     Mental Status: He is alert.  Psychiatric:        Mood and Affect: Mood normal.     ED Results / Procedures / Treatments   Labs (all labs ordered are listed, but only  abnormal results are displayed) Labs Reviewed  BASIC METABOLIC PANEL - Abnormal; Notable for the following components:      Result Value   Glucose, Bld 131 (*)    All other components within normal limits  CBC -  Abnormal; Notable for the following components:   Hemoglobin 12.5 (*)    RDW 15.9 (*)    All other components within normal limits  CBG MONITORING, ED - Abnormal; Notable for the following components:   Glucose-Capillary 102 (*)    All other components within normal limits  SARS CORONAVIRUS 2 BY RT PCR  TROPONIN I (HIGH SENSITIVITY)  TROPONIN I (HIGH SENSITIVITY)    EKG None  Radiology DG Chest 2 View  Result Date: 03/17/2023 CLINICAL DATA:  Chest tightness EXAM: CHEST - 2 VIEW COMPARISON:  02/12/2023, 08/13/2022 FINDINGS: Post sternotomy changes. Possible trace pleural effusions. Suspected left lower lobe atelectasis. Stable cardiomediastinal silhouette with aortic atherosclerosis. No pneumothorax IMPRESSION: Possible trace pleural effusions. Suspected left lower lobe atelectasis, recommend contrast-enhanced chest CT for further assessment given persistence from chest x-ray 1 month ago Electronically Signed   By: Jasmine Pang M.D.   On: 03/17/2023 17:08    Procedures Procedures  {Document cardiac monitor, telemetry assessment procedure when appropriate:1}  Medications Ordered in ED Medications  morphine (PF) 4 MG/ML injection 4 mg (4 mg Intravenous Given 03/17/23 1924)    ED Course/ Medical Decision Making/ A&P   {   Click here for ABCD2, HEART and other calculatorsREFRESH Note before signing :1}                              Medical Decision Making Amount and/or Complexity of Data Reviewed Labs: ordered. Radiology: ordered.  Risk Prescription drug management.   ***  {Document critical care time when appropriate:1} {Document review of labs and clinical decision tools ie heart score, Chads2Vasc2 etc:1}  {Document your independent review of radiology images,  and any outside records:1} {Document your discussion with family members, caretakers, and with consultants:1} {Document social determinants of health affecting pt's care:1} {Document your decision making why or why not admission, treatments were needed:1} Final Clinical Impression(s) / ED Diagnoses Final diagnoses:  None    Rx / DC Orders ED Discharge Orders     None

## 2023-03-17 NOTE — ED Notes (Signed)
Entered patient's room to introduce myself and assess patient, as well as place IV for CT scan. Patient's wife informed this nurse that he does not need a CT scan, that he recently had tests done at the [place] and got verbally upset with this nurse. This nurse explained to the patient and his wife as much as possible, and that the provider would be notified of their request. Pain medication administered as ordered; provider notified

## 2023-03-17 NOTE — Discharge Instructions (Addendum)
Pleasure taking care of you today.  Your symptoms are consistent with pneumonia again.  Oxygen is normal, your EKG was reassuring and your troponin test was normal as well.  Use your albuterol at home as needed, you should have outpatient follow-up with primary care to make sure chest x-ray has improved and that you do not need further testing.  Come back to the ER for any new or worsening symptoms.

## 2023-08-11 ENCOUNTER — Ambulatory Visit: Payer: Medicare Other | Admitting: Urology

## 2023-08-19 ENCOUNTER — Ambulatory Visit: Payer: Medicare Other | Admitting: Urology

## 2023-08-26 ENCOUNTER — Encounter: Payer: Self-pay | Admitting: Urology

## 2023-08-26 ENCOUNTER — Ambulatory Visit (INDEPENDENT_AMBULATORY_CARE_PROVIDER_SITE_OTHER): Payer: Medicare Other | Admitting: Urology

## 2023-08-26 VITALS — BP 134/74 | HR 85

## 2023-08-26 DIAGNOSIS — N138 Other obstructive and reflux uropathy: Secondary | ICD-10-CM

## 2023-08-26 DIAGNOSIS — N401 Enlarged prostate with lower urinary tract symptoms: Secondary | ICD-10-CM | POA: Diagnosis not present

## 2023-08-26 LAB — BLADDER SCAN AMB NON-IMAGING

## 2023-08-26 MED ORDER — SILODOSIN 8 MG PO CAPS: 8.0000 mg | ORAL_CAPSULE | Freq: Every day | ORAL | 3 refills | Status: AC

## 2023-08-26 MED ORDER — FINASTERIDE 5 MG PO TABS: 5.0000 mg | ORAL_TABLET | Freq: Every day | ORAL | 3 refills | Status: AC

## 2023-08-26 NOTE — Progress Notes (Signed)
   Assessment: 1. BPH with obstruction/lower urinary tract symptoms     Plan: Continue rapaflo and finasteride FU 1 yr or sooner if problems arise  Chief Complaint: No chief complaint on file.   HPI: Jeff Moran is a 73 y.o. male who presents for continued evaluation of BPH, GERD in the past by urinary retention.  Please see my prior note 10/22/2022 at the time of initial visit and 02/04/2023 at the time of last follow-up. Patient continues to do extremely well on combination medical therapy with silodosin and finasteride.  He has had a wonderful response in terms of symptom improvement.  Current IPSS = 4.  PVRs have demonstrated good emptying.  Today he could not void in the office and random bladder scan was 70 mL.   Portions of the above documentation were copied from a prior visit for review purposes only.  Allergies: No Known Allergies  PMH: Past Medical History:  Diagnosis Date   Anxiety    Asthma    Bronchitis    acute   CAD (coronary artery disease)    Chest pain    unspecifide   Diabetes mellitus    Dyslipidemia    statin intolerance triglycerides 704total cholesterol 249   Dyspnea    GERD (gastroesophageal reflux disease)    post traumatic stress disorder bruxism   Hyperlipidemia    Hypertension    Myocardial infarction (HCC)    non-St elevation myocardial infarction 07/08/2010 triple vessel disease with PTCA andplacement of a drug eluting stent to the right coronary artery   Posttraumatic stress disorder    Rhabdomyolysis    S/P angioplasty with stent    atatus post drug eluting stent to a subtotally occluded first marginal there were 2009 status post drug eluting stent of the circumflex 05/2005 preserved LV function   Sleep apnea    Sleep apnea, obstructive    c-pap    PSH: Past Surgical History:  Procedure Laterality Date   bilaterial hand surgery     right and left    SH: Social History   Tobacco Use   Smoking status: Never  Substance Use  Topics   Alcohol use: No   Drug use: No    ROS: Constitutional:  Negative for fever, chills, weight loss CV: Negative for chest pain, previous MI, hypertension Respiratory:  Negative for shortness of breath, wheezing, sleep apnea, frequent cough GI:  Negative for nausea, vomiting, bloody stool, GERD  PE: BP 134/74   Pulse 85  GENERAL APPEARANCE:  Well appearing, well developed, well nourished, NAD

## 2024-04-20 ENCOUNTER — Encounter: Payer: Self-pay | Admitting: General Practice

## 2024-08-09 ENCOUNTER — Ambulatory Visit: Admitting: Urology

## 2024-08-24 ENCOUNTER — Ambulatory Visit: Admitting: Urology
